# Patient Record
Sex: Female | Born: 1965 | Race: Black or African American | Hispanic: No | Marital: Married | Smoking: Never smoker
Health system: Southern US, Community
[De-identification: ages and names within clinical notes are randomized; demographics above are authoritative.]

## PROBLEM LIST (undated history)

## (undated) DIAGNOSIS — E785 Hyperlipidemia, unspecified: Secondary | ICD-10-CM

## (undated) DIAGNOSIS — J45909 Unspecified asthma, uncomplicated: Secondary | ICD-10-CM

## (undated) DIAGNOSIS — T7840XA Allergy, unspecified, initial encounter: Secondary | ICD-10-CM

## (undated) DIAGNOSIS — F419 Anxiety disorder, unspecified: Secondary | ICD-10-CM

## (undated) DIAGNOSIS — I10 Essential (primary) hypertension: Secondary | ICD-10-CM

## (undated) DIAGNOSIS — I519 Heart disease, unspecified: Secondary | ICD-10-CM

## (undated) DIAGNOSIS — D869 Sarcoidosis, unspecified: Secondary | ICD-10-CM

## (undated) DIAGNOSIS — L509 Urticaria, unspecified: Secondary | ICD-10-CM

## (undated) DIAGNOSIS — J069 Acute upper respiratory infection, unspecified: Secondary | ICD-10-CM

## (undated) HISTORY — DX: Allergy, unspecified, initial encounter: T78.40XA

## (undated) HISTORY — DX: Anxiety disorder, unspecified: F41.9

## (undated) HISTORY — DX: Heart disease, unspecified: I51.9

## (undated) HISTORY — DX: Hyperlipidemia, unspecified: E78.5

## (undated) HISTORY — DX: Unspecified asthma, uncomplicated: J45.909

## (undated) HISTORY — DX: Essential (primary) hypertension: I10

## (undated) HISTORY — DX: Sarcoidosis, unspecified: D86.9

## (undated) HISTORY — DX: Urticaria, unspecified: L50.9

## (undated) HISTORY — DX: Acute upper respiratory infection, unspecified: J06.9

## (undated) HISTORY — PX: TOTAL HIP ARTHROPLASTY: SHX124

---

## 2000-09-09 ENCOUNTER — Emergency Department (HOSPITAL_COMMUNITY): Admission: EM | Admit: 2000-09-09 | Discharge: 2000-09-09 | Payer: Self-pay | Admitting: Emergency Medicine

## 2000-09-09 ENCOUNTER — Encounter: Payer: Self-pay | Admitting: Emergency Medicine

## 2015-04-21 HISTORY — PX: CARDIAC CATHETERIZATION: SHX172

## 2015-09-30 ENCOUNTER — Other Ambulatory Visit: Payer: Self-pay | Admitting: Family Medicine

## 2015-09-30 DIAGNOSIS — N6489 Other specified disorders of breast: Principal | ICD-10-CM

## 2015-10-04 ENCOUNTER — Other Ambulatory Visit: Payer: Self-pay | Admitting: Family Medicine

## 2015-10-04 ENCOUNTER — Ambulatory Visit
Admission: RE | Admit: 2015-10-04 | Discharge: 2015-10-04 | Disposition: A | Discharge status: Home / Self Care | Payer: 59 | Source: Ambulatory Visit | Attending: Family Medicine | Admitting: Family Medicine

## 2015-10-04 DIAGNOSIS — N6489 Other specified disorders of breast: Principal | ICD-10-CM

## 2015-10-22 HISTORY — PX: BREAST LUMPECTOMY: SHX2

## 2016-07-06 ENCOUNTER — Ambulatory Visit (INDEPENDENT_AMBULATORY_CARE_PROVIDER_SITE_OTHER): Payer: 59 | Admitting: Allergy and Immunology

## 2016-07-06 ENCOUNTER — Encounter: Payer: Self-pay | Admitting: Allergy and Immunology

## 2016-07-06 VITALS — BP 162/100 | HR 80 | Temp 98.1°F | Resp 20 | Ht 62.0 in | Wt 155.4 lb

## 2016-07-06 DIAGNOSIS — H1010 Acute atopic conjunctivitis, unspecified eye: Secondary | ICD-10-CM

## 2016-07-06 DIAGNOSIS — D869 Sarcoidosis, unspecified: Secondary | ICD-10-CM

## 2016-07-06 DIAGNOSIS — K219 Gastro-esophageal reflux disease without esophagitis: Secondary | ICD-10-CM

## 2016-07-06 DIAGNOSIS — J4540 Moderate persistent asthma, uncomplicated: Secondary | ICD-10-CM

## 2016-07-06 DIAGNOSIS — T7800XD Anaphylactic reaction due to unspecified food, subsequent encounter: Secondary | ICD-10-CM

## 2016-07-06 DIAGNOSIS — R05 Cough: Secondary | ICD-10-CM

## 2016-07-06 DIAGNOSIS — J3089 Other allergic rhinitis: Principal | ICD-10-CM

## 2016-07-06 DIAGNOSIS — T7800XA Anaphylactic reaction due to unspecified food, initial encounter: Secondary | ICD-10-CM

## 2016-07-06 DIAGNOSIS — H1013 Acute atopic conjunctivitis, bilateral: Secondary | ICD-10-CM

## 2016-07-06 DIAGNOSIS — R053 Chronic cough: Secondary | ICD-10-CM

## 2016-07-06 MED ORDER — LEVOCETIRIZINE DIHYDROCHLORIDE 5 MG PO TABS: 5.0000 mg | ORAL_TABLET | Freq: Every evening | ORAL | 5 refills | Status: DC

## 2016-07-06 MED ORDER — EPINEPHRINE 0.3 MG/0.3ML IJ SOAJ: INTRAMUSCULAR | 2 refills | Status: DC

## 2016-07-06 MED ORDER — TIOTROPIUM BROMIDE MONOHYDRATE 1.25 MCG/ACT IN AERS: 2.0000 | INHALATION_SPRAY | Freq: Every day | RESPIRATORY_TRACT | 3 refills | Status: DC

## 2016-07-06 MED ORDER — AZELASTINE HCL 0.05 % OP SOLN: 1.0000 [drp] | Freq: Two times a day (BID) | OPHTHALMIC | 5 refills | Status: DC

## 2016-07-06 MED ORDER — ALBUTEROL SULFATE HFA 108 (90 BASE) MCG/ACT IN AERS: INHALATION_SPRAY | RESPIRATORY_TRACT | 1 refills | Status: DC

## 2016-07-06 MED ORDER — FLUTICASONE PROPIONATE 93 MCG/ACT NA EXHU: 2.0000 | INHALANT_SUSPENSION | Freq: Two times a day (BID) | NASAL | 5 refills | Status: DC

## 2016-07-06 MED ORDER — BUDESONIDE-FORMOTEROL FUMARATE 160-4.5 MCG/ACT IN AERO: 2.0000 | INHALATION_SPRAY | Freq: Two times a day (BID) | RESPIRATORY_TRACT | 5 refills | Status: DC

## 2016-07-06 MED ORDER — FLUTTER DEVI: 1 refills | Status: DC

## 2016-07-06 NOTE — Patient Instructions (Addendum)
Perennial and seasonal allergic rhinitis  Aeroallergen avoidance measures have been discussed and provided in written form.  A prescription has been provided for levocetirizine, 5mg  daily as needed.  A prescription has been provided for [facility]Xhance, 2 actuations per nostril twice a day. Proper technique has been discussed and demonstrated.  Nasal saline lavage (NeilMed) has been recommended as needed and prior to medicated nasal sprays along with instructions for proper administration.  For thick post nasal drainage, add guaifenesin 1200 mg (Mucinex Maximum Strength)  twice daily as needed with adequate hydration as discussed.  We will not restart immunotherapy at this time, however if allergen avoidance measures and medications fail to adequately alleviate symptoms, this option will be considered.  Allergic conjunctivitis  Treatment plan as outlined above for allergic rhinitis.  A prescription has been provided for Pataday, one drop per eye daily as needed.  I have also recommended eye lubricant drops (i.e., Natural Tears or Systane) as needed.  Moderate persistent asthma As coughing is a prominent feature of her symptom constellation, we will switch her away from dry powder inhalers.  A prescription has been [facility] (mometasone/formoterol) 200/5 g,  2 inhalations twice a day. To maximize pulmonary deposition, a spacer has been provided along with instructions for its proper administration with an HFA inhaler.  Discontinue Breo Ellipta.  A prescription for Spiriva 1.25 mcg Respimat, 2 inhalations daily.  Continue albuterol every 4-6 hours as needed.   Subjective and objective measures of pulmonary function will be followed and the treatment plan will be adjusted accordingly.  Acid reflux  Appropriate reflux lifestyle modifications have been discussed and provided.  Continue omeprazole as prescribed.  Cough, persistent The most common causes of chronic cough include  the following: upper airway cough syndrome (UACS) which is caused by variety of rhinosinus conditions; asthma; gastroesophageal reflux disease (GERD); chronic bronchitis from cigarette smoking or other inhaled environmental irritants; non-asthmatic eosinophilic bronchitis; and bronchiectasis. In prospective studies, these conditions have accounted for up to 94% of the causes of chronic cough in immunocompetent adults. The history and physical examination suggest that her cough is multifactorial with contribution from postnasal drainage, bronchial hyperresponsiveness, and acid reflux. We will address these issues at this time.   A prescription has been provided for a flutter valve to be used as needed to break the coughing cycle.  Treatment plan as outlined above.    We will regroup in 2 months to assess treatment response and adjust therapy accordingly.  Sarcoidosis  Follow up with Dr. Su MonksEjaz as directed for monitoring and treatment.  Food allergy  Continue careful avoidance of tree nuts and have access to epinephrine autoinjector 2 pack in case of accidental ingestion.  Food allergy action plan has been provided and discussed.   Return in about 2 months (around 09/05/2016), or if symptoms worsen or fail to improve.  Reducing Pollen Exposure  The American Academy of Allergy, Asthma and Immunology suggests the following steps to reduce your exposure to pollen during allergy seasons.    1. Do not hang sheets or clothing out to dry; pollen may collect on these items. 2. Do not mow lawns or spend time around freshly cut grass; mowing stirs up pollen. 3. Keep windows closed at night.  Keep car windows closed while driving. 4. Minimize morning activities outdoors, a time when pollen counts are usually at their highest. 5. Stay indoors as much as possible when pollen counts or humidity is high and on windy days when pollen tends to  remain in the air longer. 6. Use air conditioning when possible.   Many air conditioners have filters that trap the pollen spores. 7. Use a HEPA room air filter to remove pollen form the indoor air you breathe.   Control of Mold Allergen  Mold and fungi can grow on a variety of surfaces provided certain temperature and moisture conditions exist.  Outdoor molds grow on plants, decaying vegetation and soil.  The major outdoor mold, Alternaria and Cladosporium, are found in very high numbers during hot and dry conditions.  Generally, a late Summer - Fall peak is seen for common outdoor fungal spores.  Rain will temporarily lower outdoor mold spore count, but counts rise rapidly when the rainy period ends.  The most important indoor molds are Aspergillus and Penicillium.  Dark, humid and poorly ventilated basements are ideal sites for mold growth.  The next most common sites of mold growth are the bathroom and the kitchen.  Outdoor [employer] 3. Use air conditioning and keep windows closed 4. Avoid exposure to decaying vegetation. 5. Avoid leaf raking. 6. Avoid grain handling. 7. Consider wearing a face mask if working in moldy areas.  Indoor Mold Control 1. Maintain humidity below 50%. 2. Clean washable surfaces with 5% bleach solution. 3. Remove sources e.g. Contaminated carpets.  Control of Cockroach Allergen  Cockroach allergen has been identified as an important cause of acute attacks of asthma, especially in urban settings.  There are fifty-five species of cockroach that exist in the [place], however only three, the [place], [place] species produce allergen that can affect patients with Asthma.  Allergens can be obtained from fecal particles, egg casings and secretions from cockroaches.    1. Remove food sources. 2. Reduce access to water. 3. Seal access and entry points. 4. Spray runways with 0.5-1% Diazinon or Chlorpyrifos 5. Blow boric acid power under stoves and refrigerator. 6. Place bait stations (hydramethylnon) at feeding  sites.

## 2016-07-06 NOTE — Assessment & Plan Note (Signed)
   Appropriate reflux lifestyle modifications have been discussed and provided.  Continue omeprazole as prescribed.

## 2016-07-06 NOTE — Progress Notes (Signed)
New Patient Note  RE: Toni FalconerVanessa Molnar MRN: [id number] DOB: 04/30/1965 Date of Office Visit: 07/06/2016  Referring provider: Marisue BrooklynEjaz, Muhammad Shakir, * Primary care provider: Marisue BrooklynEjaz, Muhammad Shakir, MD  Chief Complaint: Allergic Rhinitis  and Asthma   History of present illness: Toni Hughes is a 51 y.o. female seen today in consultation requested by Marisue BrooklynMuhammad Shakir Ejaz, MD.  She complains of nasal congestion, rhinorrhea, sneezing, nasal pruritus, ocular pruritus, thick postnasal drainage, and occasional sinus pressure.  These symptoms occur year around but tender be more frequent and severe during the springtime.  She was started on aeroallergen immunotherapy at Dr. Kathi DerMoore's office last year but discontinued because she developed hives after some the injections.  She restarted this year, however reports that she experienced bruising at the injection sites, so she discontinued once again.  She has moderate persistent asthma with symptoms consisting of dyspnea, coughing, chest tightness, and wheezing.  She is currently taking Breo Ellipta 100-25 g, one inhalation in the morning, and Incruze Ellipta, 1 inhalation at bedtime.  These medications help to alleviate the chest tightness, dyspnea, and wheezing, however do not seem to provide benefit for the coughing.  The coughing is described as persistent and severe, occasionally resulting and posttussive emesis.  The cough feels like it originates from the base of her throat with a globus sensation that she is unable to expectorate.  She was diagnosed with sarcoidosis approximately 10 years ago.  This problem is being followed by her pulmonologist, Dr. Su MonksEjaz.  She reports that she has had systemic reactions with the consumption of tree nuts, the allergy having been confirmed with positive skin tests.  She avoids tree nuts but needs a refill for epinephrine autoinjectors.   Assessment and plan: Perennial and seasonal allergic rhinitis  Aeroallergen  avoidance measures have been discussed and provided in written form.  A prescription has been provided for levocetirizine, 5mg  daily as needed.  [facility], 2 actuations per nostril twice a day. Proper technique has been discussed and demonstrated.  Nasal saline lavage (NeilMed) has been recommended as needed and prior to medicated nasal sprays along with instructions for proper administration.  For thick post nasal drainage, add guaifenesin 1200 mg (Mucinex Maximum Strength)  twice daily as needed with adequate hydration as discussed.  We will not restart immunotherapy at this time, however if allergen avoidance measures and medications fail to adequately alleviate symptoms, this option will be considered.  Allergic conjunctivitis  Treatment plan as outlined above for allergic rhinitis.  A prescription has been provided for Pataday, one drop per eye daily as needed.  I have also recommended eye lubricant drops (i.e., Natural Tears or Systane) as needed.  Moderate persistent asthma As coughing is a prominent feature of her symptom constellation, we will switch her away from dry powder inhalers.  [facility] (mometasone/formoterol) 200/5 g,  2 inhalations twice a day. To maximize pulmonary deposition, a spacer has been provided along with instructions for its proper administration with an HFA inhaler.  Discontinue Breo Ellipta.  A prescription for Spiriva 1.25 mcg Respimat, 2 inhalations daily.  Continue albuterol every 4-6 hours as needed.   Subjective and objective measures of pulmonary function will be followed and the treatment plan will be adjusted accordingly.  Acid reflux  Appropriate reflux lifestyle modifications have been discussed and provided.  Continue omeprazole as prescribed.  Cough, persistent The most common causes of chronic cough include the following: upper airway  cough syndrome (UACS) which  is caused by variety of rhinosinus conditions; asthma; gastroesophageal reflux disease (GERD); chronic bronchitis from cigarette smoking or other inhaled environmental irritants; non-asthmatic eosinophilic bronchitis; and bronchiectasis. In prospective studies, these conditions have accounted for up to 94% of the causes of chronic cough in immunocompetent adults. The history and physical examination suggest that her cough is multifactorial with contribution from postnasal drainage, bronchial hyperresponsiveness, and acid reflux. We will address these issues at this time.   A prescription has been provided for a flutter valve to be used as needed to break the coughing cycle.  Treatment plan as outlined above.    We will regroup in 2 months to assess treatment response and adjust therapy accordingly.  Sarcoidosis  Follow up with Dr. Su Monks as directed for monitoring and treatment.  Food allergy  Continue careful avoidance of tree nuts and have access to epinephrine autoinjector 2 pack in case of accidental ingestion.  Food allergy action plan has been provided and discussed.   Meds ordered this encounter  Medications  . levocetirizine (XYZAL) 5 MG tablet    Sig: Take 1 tablet (5 mg total) by mouth every evening.    Dispense:  30 tablet    Refill:  5  . Fluticasone Propionate (XHANCE) 93 MCG/ACT EXHU    Sig: Place 2 sprays into the nose 2 (two) times daily.    Dispense:  16 mL    Refill:  5  . Tiotropium Bromide Monohydrate (SPIRIVA RESPIMAT) 1.25 MCG/ACT AERS    Sig: Inhale 2 puffs into the lungs daily.    Dispense:  4 g    Refill:  3  . Respiratory Therapy Supplies (FLUTTER) DEVI    Sig: Use as directed.    Dispense:  1 each    Refill:  1  . EPINEPHrine (AUVI-Q) 0.3 mg/0.3 mL IJ SOAJ injection    Sig: Use as directed for severe allergic reaction.    Dispense:  2 Device    Refill:  2  . albuterol (VENTOLIN HFA) 108 (90 Base) MCG/ACT inhaler    Sig: 2 puffs every 4 hours if  needed for cough or wheeze.    Dispense:  8 g    Refill:  1  . budesonide-formoterol (SYMBICORT) 160-4.5 MCG/ACT inhaler    Sig: Inhale 2 puffs into the lungs 2 (two) times daily.    Dispense:  1 Inhaler    Refill:  5  . azelastine (OPTIVAR) 0.05 % ophthalmic solution    Sig: Place 1 drop into both eyes 2 (two) times daily.    Dispense:  6 mL    Refill:  5    Diagnostics: Spirometry: Spirometry reveals an FVC of 2.50 L) 96% predicted) and an FEV1 of 1.60 L (77% predicted) with 120 mL (8%) post bronchodilator improvement.  Mild airways obstruction with partial reversibility. This study was performed while the patient was asymptomatic.  Please see scanned spirometry results for details. Epicutaneous testing: Positive to tree pollens and molds. Intradermal testing: Positive to cockroach antigen.    Physical examination: Blood pressure (!) 162/100, pulse 80, temperature 98.1 F (36.7 C), temperature source Oral, resp. rate 20, height 5\' 2"  (1.575 m), weight 155 lb 6.8 oz (70.5 kg), SpO2 96 %.  General: Alert, interactive, in no acute distress. HEENT: TMs pearly gray, turbinates edematous with thick discharge, post-pharynx moderately erythematous. Neck: Supple without lymphadenopathy. Lungs: Clear to auscultation without wheezing, rhonchi or rales. CV: Normal S1, S2 without murmurs. Abdomen: Nondistended, nontender. Skin: Warm  and dry, without lesions or rashes. Extremities:  No clubbing, cyanosis or edema. Neuro:   Grossly intact.  Review of systems:  Review of systems negative except as noted in HPI / PMHx or noted below: Review of Systems  Constitutional: Negative.   HENT: Negative.   Eyes: Negative.   Respiratory: Negative.   Cardiovascular: Negative.   Gastrointestinal: Negative.   Genitourinary: Negative.   Musculoskeletal: Negative.   Skin: Negative.   Neurological: Negative.   Endo/Heme/Allergies: Negative.   Psychiatric/Behavioral: Negative.     Past medical  history:  Past Medical History:  Diagnosis Date  . Allergy   . Anxiety   . Asthma   . Heart disease   . Hyperlipidemia   . Hypertension   . Sarcoidosis     Past surgical history:  Past Surgical History:  Procedure Laterality Date  . BREAST LUMPECTOMY Right 10/2015  . CARDIAC CATHETERIZATION  04/2015    Family history: Family History  Problem Relation Age of Onset  . Asthma Mother   . Allergic rhinitis Sister   . Asthma Sister   . Allergic rhinitis Brother   . Migraines Brother   . Allergic rhinitis Son   . Sinusitis Son   . Allergic rhinitis Daughter   . Asthma Daughter     Social history: Social History   Social History  . Marital status: Married    Spouse name: N/A  . Number of children: N/A  . Years of education: N/A   Occupational History  . Not on file.   Social History Main Topics  . Smoking status: Never Smoker  . Smokeless tobacco: Never Used  . Alcohol use No  . Drug use: No  . Sexual activity: Not on file   Other Topics Concern  . Not on file   Social History Narrative  . No narrative on file   Environmental History: The patient lives in a 53 year old house with carpeting throughout and central air/heat.  She is a nonsmoker without pets.  Allergies as of 07/06/2016      Reactions   Codeine Hives   Paranoid   Diphenhydramine Hcl Hives   Other Hives, Itching   Peanuts   Terbinafine Hives, Itching   Skin discoloration   Lamisil  [terbinafine Hcl] Hives   Penicillins Hives      Medication List       Accurate as of 07/06/16  2:57 PM. Always use your most recent med list.          albuterol 108 (90 Base) MCG/ACT inhaler Commonly known as:  VENTOLIN HFA 2 puffs every 4 hours if needed for cough or wheeze.   amLODipine 10 MG tablet Commonly known as:  NORVASC Take by mouth.   aspirin 81 MG chewable tablet Chew by mouth.   azelastine 0.05 % ophthalmic solution Commonly known as:  OPTIVAR Place 1 drop into both eyes 2 (two)  times daily.   budesonide-formoterol 160-4.5 MCG/ACT inhaler Commonly known as:  SYMBICORT Inhale 2 puffs into the lungs 2 (two) times daily.   cetirizine 10 MG tablet Commonly known as:  ZYRTEC Take by mouth.   EPINEPHrine 0.3 mg/0.3 mL Soaj injection Commonly known as:  AUVI-Q Use as directed for severe allergic reaction.   fexofenadine 180 MG tablet Commonly known as:  ALLEGRA Take 180 mg by mouth.   fluticasone furoate-vilanterol 100-25 MCG/INH Aepb Commonly known as:  BREO ELLIPTA Inhale into the lungs.   Fluticasone Propionate 93 MCG/ACT Exhu Commonly known as:  [employer]  2 sprays into the nose 2 (two) times daily.   FLUTTER Devi Use as directed.   hydrochlorothiazide 25 MG tablet Commonly known as:  HYDRODIURIL TAKE ONE HALF TABLET BY MOUTH EVERY DAY   isosorbide mononitrate 60 MG 24 hr tablet Commonly known as:  IMDUR Take by mouth.   levocetirizine 5 MG tablet Commonly known as:  XYZAL Take 1 tablet (5 mg total) by mouth every evening.   metoprolol tartrate 25 MG tablet Commonly known as:  LOPRESSOR Take by mouth.   mupirocin ointment 2 % Commonly known as:  BACTROBAN Apply topically.   nitroGLYCERIN 0.4 mg/hr patch Commonly known as:  NITRODUR - Dosed in mg/24 hr Place 0.4 mg onto the skin daily.   omeprazole 40 MG capsule Commonly known as:  PRILOSEC TAKE 1 CAPSULE(40 MG) BY MOUTH TWICE DAILY   Propylene Glycol 0.6 % Soln Place 1 drop into both eyes daily as needed.   rosuvastatin 40 MG tablet Commonly known as:  CRESTOR Take by mouth.   THERA Tabs Take by mouth.   ticagrelor 60 MG Tabs tablet Commonly known as:  BRILINTA Take by mouth.   Tiotropium Bromide Monohydrate 1.25 MCG/ACT Aers Commonly known as:  SPIRIVA RESPIMAT Inhale 2 puffs into the lungs daily.   traMADol 50 MG tablet Commonly known as:  ULTRAM Take by mouth.   umeclidinium bromide 62.5 MCG/INH Aepb Commonly known as:  INCRUSE ELLIPTA Inhale into the  lungs.       Known medication allergies: Allergies  Allergen Reactions  . Codeine Hives    Paranoid  . Diphenhydramine Hcl Hives  . Other Hives and Itching    Peanuts  . Terbinafine Hives and Itching    Skin discoloration  . Lamisil  [Terbinafine Hcl] Hives  . Penicillins Hives    I appreciate the opportunity to take part in Chezney's care. Please do not hesitate to contact me with questions.  Sincerely,   R. Jorene Guest, MD

## 2016-07-06 NOTE — Assessment & Plan Note (Deleted)
The most common causes of chronic cough include the following: upper airway cough syndrome (UACS) which is caused by variety of rhinosinus conditions; asthma; gastroesophageal reflux disease (GERD); chronic bronchitis from cigarette smoking or other inhaled environmental irritants; non-asthmatic eosinophilic bronchitis; and bronchiectasis. In prospective studies, these conditions have accounted for up to 94% of the causes of chronic cough in immunocompetent adults. The history and physical examination suggest that her cough is multifactorial with contribution from postnasal drainage, bronchial hyperresponsiveness, and acid reflux. We will address these issues at this time.   A prescription has been provided for a flutter valve to be used as needed to break the coughing cycle.  Treatment plan as outlined above.    We will regroup in 6 weeks to assess treatment response and adjust therapy accordingly.

## 2016-07-06 NOTE — Assessment & Plan Note (Signed)
   Follow up with Dr. Su MonksEjaz as directed for monitoring and treatment.

## 2016-07-06 NOTE — Assessment & Plan Note (Addendum)
As coughing is a prominent feature of her symptom constellation, we will switch her away from dry powder inhalers.  A [facility] (mometasone/formoterol) 200/5 g, 2 inhalations twice a day. To maximize pulmonary deposition, a spacer has been provided along with instructions for its proper administration with an HFA inhaler.  Discontinue Breo Ellipta.  A prescription for Spiriva 1.25 mcg Respimat, 2 inhalations daily.  Continue albuterol every 4-6 hours as needed.   Subjective and objective measures of pulmonary function will be followed and the treatment plan will be adjusted accordingly.

## 2016-07-06 NOTE — Assessment & Plan Note (Signed)
   Treatment plan as outlined above for allergic rhinitis.  A prescription has been provided for Pataday, one drop per eye daily as needed.  I have also recommended eye lubricant drops (i.e., Natural Tears or Systane) as needed.

## 2016-07-06 NOTE — Assessment & Plan Note (Addendum)
The most common causes of chronic cough include the following: upper airway cough syndrome (UACS) which is caused by variety of rhinosinus conditions; asthma; gastroesophageal reflux disease (GERD); chronic bronchitis from cigarette smoking or other inhaled environmental irritants; non-asthmatic eosinophilic bronchitis; and bronchiectasis. In prospective studies, these conditions have accounted for up to 94% of the causes of chronic cough in immunocompetent adults. The history and physical examination suggest that her cough is multifactorial with contribution from postnasal drainage, bronchial hyperresponsiveness, and acid reflux. We will address these issues at this time.   A prescription has been provided for a flutter valve to be used as needed to break the coughing cycle.  Treatment plan as outlined above.    We will regroup in 2 months to assess treatment response and adjust therapy accordingly.

## 2016-07-06 NOTE — Assessment & Plan Note (Addendum)
   Aeroallergen avoidance measures have been discussed and provided in written form.  A prescription has been provided for levocetirizine, 5mg  daily as needed.  [facility] 2 actuations per nostril twice a day. Proper technique has been discussed and demonstrated.  Nasal saline lavage (NeilMed) has been recommended as needed and prior to medicated nasal sprays along with instructions for proper administration.  For thick post nasal drainage, add guaifenesin 1200 mg (Mucinex Maximum Strength)  twice daily as needed with adequate hydration as discussed.  We will not restart immunotherapy at this time, however if allergen avoidance measures and medications fail to adequately alleviate symptoms, this option will be considered.

## 2016-07-06 NOTE — Assessment & Plan Note (Signed)
   Continue careful avoidance of tree nuts and have access to epinephrine autoinjector 2 pack in case of accidental ingestion.  Food allergy action plan has been provided and discussed.

## 2016-07-10 ENCOUNTER — Telehealth: Payer: Self-pay | Admitting: Allergy

## 2016-07-10 NOTE — Telephone Encounter (Signed)
Pharmacy  Called and wanted to know if is was both nostrils. informed them it was both and they wanted to know if they could give her two bottles for a month and I told them it was. [facility]

## 2016-09-27 ENCOUNTER — Ambulatory Visit (INDEPENDENT_AMBULATORY_CARE_PROVIDER_SITE_OTHER): Payer: 59 | Admitting: Allergy and Immunology

## 2016-09-27 ENCOUNTER — Encounter: Payer: Self-pay | Admitting: Allergy and Immunology

## 2016-09-27 VITALS — BP 134/90 | HR 60 | Temp 98.0°F | Resp 20

## 2016-09-27 DIAGNOSIS — D869 Sarcoidosis, unspecified: Secondary | ICD-10-CM

## 2016-09-27 DIAGNOSIS — J4541 Moderate persistent asthma with (acute) exacerbation: Principal | ICD-10-CM

## 2016-09-27 DIAGNOSIS — J3089 Other allergic rhinitis: Secondary | ICD-10-CM

## 2016-09-27 DIAGNOSIS — J019 Acute sinusitis, unspecified: Secondary | ICD-10-CM

## 2016-09-27 DIAGNOSIS — J0100 Acute maxillary sinusitis, unspecified: Secondary | ICD-10-CM

## 2016-09-27 DIAGNOSIS — T7800XD Anaphylactic reaction due to unspecified food, subsequent encounter: Secondary | ICD-10-CM

## 2016-09-27 MED ORDER — BUDESONIDE-FORMOTEROL FUMARATE 160-4.5 MCG/ACT IN AERO: 2.0000 | INHALATION_SPRAY | Freq: Two times a day (BID) | RESPIRATORY_TRACT | 5 refills | Status: DC

## 2016-09-27 MED ORDER — ALBUTEROL SULFATE HFA 108 (90 BASE) MCG/ACT IN AERS: INHALATION_SPRAY | RESPIRATORY_TRACT | 1 refills | Status: DC

## 2016-09-27 MED ORDER — TIOTROPIUM BROMIDE MONOHYDRATE 18 MCG IN CAPS: 18.0000 ug | ORAL_CAPSULE | Freq: Every day | RESPIRATORY_TRACT | 5 refills | Status: DC

## 2016-09-27 NOTE — Assessment & Plan Note (Signed)
   Continue meticulous avoidance of tree nuts and have access to epinephrine autoinjector 2 pack in case of accidental ingestion.  Food allergy action plan has been provided and discussed.

## 2016-09-27 NOTE — Assessment & Plan Note (Signed)
   Prednisone has been provided (as above).  Continue Xhance, 2 sprays per nostril twice daily.  Nasal saline lavage (NeilMed) has been recommended as needed and prior to medicated nasal sprays along with instructions for proper administration.  For thick post nasal drainage, nasal congestion, and/or sinus pressure, add guaifenesin 1200 mg (Mucinex Maximum Strength) plus/minus pseudoephedrine 120 mg  twice daily as needed with adequate hydration as discussed. Pseudoephedrine is only to be used for short-term relief of nasal/sinus congestion. Long-term use is discouraged due to potential side effects.  The patient has been asked to contact me if her symptoms persist, progress, or if she becomes febrile.

## 2016-09-27 NOTE — Patient Instructions (Signed)
Moderate persistent asthma  Prednisone has been provided, 20 mg x 4 days, 10 mg x1 day, then stop.  Start Symbicort 160-4.5 g, 2 inhalations via spacer device twice a day, and Spiriva 18 g, one inhalation daily.  A prescription has been provided.  Start Spiriva 18 g, one inhalation daily.  A prescription has been provided.  Continue albuterol HFA, 1-2 inhalations every 4-6 hours as needed.  The patient has been asked to contact me if her symptoms persist or progress. Otherwise, she may return for follow up in 4 months.  Acute sinusitis  Prednisone has been provided (as above).  Continue Xhance, 2 sprays per nostril twice daily.  Nasal saline lavage (NeilMed) has been recommended as needed and prior to medicated nasal sprays along with instructions for proper administration.  For thick post nasal drainage, nasal congestion, and/or sinus pressure, add guaifenesin 1200 mg (Mucinex Maximum Strength) plus/minus pseudoephedrine 120 mg  twice daily as needed with adequate hydration as discussed. Pseudoephedrine is only to be used for short-term relief of nasal/sinus congestion. Long-term use is discouraged due to potential side effects.  The patient has been asked to contact me if her symptoms persist, progress, or if she becomes febrile.   Food allergy  Continue meticulous avoidance of tree nuts and have access to epinephrine autoinjector 2 pack in case of accidental ingestion.  Food allergy action plan has been provided and discussed.  Sarcoidosis  Follow up with Dr. Su MonksEjaz as directed for monitoring.   Return in about 4 months (around 01/27/2017), or if symptoms worsen or fail to improve.

## 2016-09-27 NOTE — Assessment & Plan Note (Signed)
   Follow up with Dr. Su MonksEjaz as directed for monitoring.

## 2016-09-27 NOTE — Assessment & Plan Note (Addendum)
   Prednisone has been provided, 20 mg x 4 days, 10 mg x1 day, then stop.  Start Symbicort 160-4.5 g, 2 inhalations via spacer device twice a day, and Spiriva 18 g, one inhalation daily.  A prescription has been provided.  Start Spiriva 18 g, one inhalation daily.  A prescription has been provided.  Continue albuterol HFA, 1-2 inhalations every 4-6 hours as needed.  The patient has been asked to contact me if her symptoms persist or progress. Otherwise, she may return for follow up in 4 months.

## 2016-09-27 NOTE — Progress Notes (Addendum)
Follow-up Note  RE: Toni Hughes MRN: [id number] DOB: 01-31-1966 Date of Office Visit: 09/27/2016  Primary care provider: Marisue Brooklyn, MD Referring provider: Marisue Brooklyn, *  History of present illness: Toni Hughes is a 51 y.o. female with persistent asthma, allergic rhinoconjunctivitis, sarcoidosis, and food allergy presenting today for sick visit.  She is previously seen in this clinic for her initial evaluation on 07/06/2016.  She reports that over the past 3 days she has been experiencing increased coughing, sinus pressure, postnasal drainage, dyspnea, and chest tightness.  She admits that, for unclear reasons, she never picked up her control her asthma medications from the pharmacy after her initial visit.  She denies fevers, chills, or discolored mucus production.  She is currently taking Xhance nasal spray but has not been using nasal saline irrigation as recommended.   Assessment and plan: Moderate persistent asthma  Prednisone has been provided, 20 mg x 4 days, 10 mg x1 day, then stop.  Start Symbicort 160-4.5 g, 2 inhalations via spacer device twice a day, and Spiriva 18 g, one inhalation daily.  A prescription has been provided.  Start Spiriva 18 g, one inhalation daily.  A prescription has been provided.  Continue albuterol HFA, 1-2 inhalations every 4-6 hours as needed.  The patient has been asked to contact me if her symptoms persist or progress. Otherwise, she may return for follow up in 4 months.  Acute sinusitis  Prednisone has been provided (as above).  Continue Xhance, 2 sprays per nostril twice daily.  Nasal saline lavage (NeilMed) has been recommended as needed and prior to medicated nasal sprays along with instructions for proper administration.  For thick post nasal drainage, nasal congestion, and/or sinus pressure, add guaifenesin 1200 mg (Mucinex Maximum Strength) plus/minus pseudoephedrine 120 mg  twice daily as needed with  adequate hydration as discussed. Pseudoephedrine is only to be used for short-term relief of nasal/sinus congestion. Long-term use is discouraged due to potential side effects.  The patient has been asked to contact me if her symptoms persist, progress, or if she becomes febrile.   Food allergy  Continue meticulous avoidance of tree nuts and have access to epinephrine autoinjector 2 pack in case of accidental ingestion.  Food allergy action plan has been provided and discussed.  Sarcoidosis  Follow up with Dr. Su Monks as directed for monitoring.   Meds ordered this encounter  Medications  . albuterol (VENTOLIN HFA) 108 (90 Base) MCG/ACT inhaler    Sig: 2 puffs every 4 hours if needed for cough or wheeze.    Dispense:  1 Inhaler    Refill:  1  . tiotropium (SPIRIVA HANDIHALER) 18 MCG inhalation capsule    Sig: Place 1 capsule (18 mcg total) into inhaler and inhale daily.    Dispense:  30 capsule    Refill:  5  . budesonide-formoterol (SYMBICORT) 160-4.5 MCG/ACT inhaler    Sig: Inhale 2 puffs into the lungs 2 (two) times daily.    Dispense:  1 Inhaler    Refill:  5    Diagnostics: Spirometry reveals an FVC of 2.28 L and an FEV1 of 1.57 L (75% predicted) without significant post bronchodilator improvement.   Please see scanned spirometry results for details.    Physical examination: Blood pressure 134/90, pulse 60, temperature 98 F (36.7 C), temperature source Oral, resp. rate 20, SpO2 97 %.  General: Alert, interactive, in no acute distress. HEENT: TMs pearly gray, turbinates edematous without discharge, post-pharynx erythematous. Neck: Supple without lymphadenopathy.  Lungs: Clear to auscultation without wheezing, rhonchi or rales. CV: Normal S1, S2 without murmurs. Skin: Warm and dry, without lesions or rashes.   The following portions of the patient's history were reviewed and updated as appropriate: allergies, current medications, past family history, past medical  history, past social history, past surgical history and problem list.  Allergies as of 09/27/2016      Reactions   Codeine Hives   Paranoid   Diphenhydramine Hcl Hives   Other Hives, Itching   Peanuts   Terbinafine Hives, Itching   Skin discoloration   Lamisil  [terbinafine Hcl] Hives   Penicillins Hives      Medication List       Accurate as of 09/27/16  5:51 PM. Always use your most recent med list.          albuterol 108 (90 Base) MCG/ACT inhaler Commonly known as:  VENTOLIN HFA 2 puffs every 4 hours if needed for cough or wheeze.   amLODipine 10 MG tablet Commonly known as:  NORVASC Take by mouth.   aspirin 81 MG chewable tablet Chew by mouth.   azelastine 0.05 % ophthalmic solution Commonly known as:  OPTIVAR Place 1 drop into both eyes 2 (two) times daily.   budesonide-formoterol 160-4.5 MCG/ACT inhaler Commonly known as:  SYMBICORT Inhale 2 puffs into the lungs 2 (two) times daily.   budesonide-formoterol 160-4.5 MCG/ACT inhaler Commonly known as:  SYMBICORT Inhale 2 puffs into the lungs 2 (two) times daily.   cetirizine 10 MG tablet Commonly known as:  ZYRTEC Take by mouth.   EPINEPHrine 0.3 mg/0.3 mL Soaj injection Commonly known as:  AUVI-Q Use as directed for severe allergic reaction.   fexofenadine 180 MG tablet Commonly known as:  ALLEGRA Take 180 mg by mouth.   fluticasone furoate-vilanterol 100-25 MCG/INH Aepb Commonly known as:  BREO ELLIPTA Inhale into the lungs.   Fluticasone Propionate 93 MCG/ACT Exhu Commonly known as:  XHANCE Place 2 sprays into the nose 2 (two) times daily.   FLUTTER Devi Use as directed.   hydrochlorothiazide 25 MG tablet Commonly known as:  HYDRODIURIL TAKE ONE HALF TABLET BY MOUTH EVERY DAY   isosorbide mononitrate 60 MG 24 hr tablet Commonly known as:  IMDUR Take by mouth.   levocetirizine 5 MG tablet Commonly known as:  XYZAL Take 1 tablet (5 mg total) by mouth every evening.   metoprolol tartrate  25 MG tablet Commonly known as:  LOPRESSOR Take by mouth.   mupirocin ointment 2 % Commonly known as:  BACTROBAN Apply topically.   nitroGLYCERIN 0.4 mg/hr patch Commonly known as:  NITRODUR - Dosed in mg/24 hr Place 0.4 mg onto the skin daily.   omeprazole 40 MG capsule Commonly known as:  PRILOSEC TAKE 1 CAPSULE(40 MG) BY MOUTH TWICE DAILY   Propylene Glycol 0.6 % Soln Place 1 drop into both eyes daily as needed.   rosuvastatin 40 MG tablet Commonly known as:  CRESTOR Take by mouth.   THERA Tabs Take by mouth.   ticagrelor 60 MG Tabs tablet Commonly known as:  BRILINTA Take by mouth.   Tiotropium Bromide Monohydrate 1.25 MCG/ACT Aers Commonly known as:  SPIRIVA RESPIMAT Inhale 2 puffs into the lungs daily.   tiotropium 18 MCG inhalation capsule Commonly known as:  SPIRIVA HANDIHALER Place 1 capsule (18 mcg total) into inhaler and inhale daily.   traMADol 50 MG tablet Commonly known as:  ULTRAM Take by mouth.   umeclidinium bromide 62.5 MCG/INH Aepb Commonly known as:  INCRUSE ELLIPTA Inhale into the lungs.       Allergies  Allergen Reactions  . Codeine Hives    Paranoid  . Diphenhydramine Hcl Hives  . Other Hives and Itching    Peanuts  . Terbinafine Hives and Itching    Skin discoloration  . Lamisil  [Terbinafine Hcl] Hives  . Penicillins Hives   Review of systems: Review of systems negative except as noted in HPI / PMHx or noted below: Constitutional: Negative.  HENT: Negative.   Eyes: Negative.  Respiratory: Negative.   Cardiovascular: Negative.  Gastrointestinal: Negative.  Genitourinary: Negative.  Musculoskeletal: Negative.  Neurological: Negative.  Endo/Heme/Allergies: Negative.  Cutaneous: Negative.  Past Medical History:  Diagnosis Date  . Allergy   . Anxiety   . Asthma   . Heart disease   . Hyperlipidemia   . Hypertension   . Sarcoidosis     Family History  Problem Relation Age of Onset  . Asthma Mother   . Allergic  rhinitis Sister   . Asthma Sister   . Allergic rhinitis Brother   . Migraines Brother   . Allergic rhinitis Son   . Sinusitis Son   . Allergic rhinitis Daughter   . Asthma Daughter     Social History   Social History  . Marital status: Married    Spouse name: N/A  . Number of children: N/A  . Years of education: N/A   Occupational History  . Not on file.   Social History Main Topics  . Smoking status: Never Smoker  . Smokeless tobacco: Never Used  . Alcohol use No  . Drug use: No  . Sexual activity: Not on file   Other Topics Concern  . Not on file   Social History Narrative  . No narrative on file    I appreciate the opportunity to take part in [place] care. Please do not hesitate to contact me with questions.  Sincerely,   R. Jorene Guest, MD

## 2018-10-31 ENCOUNTER — Emergency Department (HOSPITAL_BASED_OUTPATIENT_CLINIC_OR_DEPARTMENT_OTHER)
Admission: EM | Admit: 2018-10-31 | Discharge: 2018-10-31 | Disposition: A | Discharge status: Home / Self Care | Payer: Medicaid Other | Attending: Emergency Medicine | Admitting: Emergency Medicine

## 2018-10-31 ENCOUNTER — Other Ambulatory Visit: Payer: Self-pay

## 2018-10-31 ENCOUNTER — Encounter (HOSPITAL_BASED_OUTPATIENT_CLINIC_OR_DEPARTMENT_OTHER): Payer: Self-pay | Admitting: Emergency Medicine

## 2018-10-31 ENCOUNTER — Emergency Department (HOSPITAL_BASED_OUTPATIENT_CLINIC_OR_DEPARTMENT_OTHER): Payer: Medicaid Other

## 2018-10-31 DIAGNOSIS — Z7982 Long term (current) use of aspirin: Secondary | ICD-10-CM | POA: Diagnosis not present

## 2018-10-31 DIAGNOSIS — J45909 Unspecified asthma, uncomplicated: Secondary | ICD-10-CM | POA: Diagnosis not present

## 2018-10-31 DIAGNOSIS — Y92007 Garden or yard of unspecified non-institutional (private) residence as the place of occurrence of the external cause: Secondary | ICD-10-CM | POA: Diagnosis not present

## 2018-10-31 DIAGNOSIS — Z79899 Other long term (current) drug therapy: Secondary | ICD-10-CM | POA: Diagnosis not present

## 2018-10-31 DIAGNOSIS — I110 Hypertensive heart disease with heart failure: Secondary | ICD-10-CM | POA: Diagnosis not present

## 2018-10-31 DIAGNOSIS — Y999 Unspecified external cause status: Secondary | ICD-10-CM | POA: Insufficient documentation

## 2018-10-31 DIAGNOSIS — Z885 Allergy status to narcotic agent status: Secondary | ICD-10-CM | POA: Insufficient documentation

## 2018-10-31 DIAGNOSIS — S93401A Sprain of unspecified ligament of right ankle, initial encounter: Secondary | ICD-10-CM | POA: Insufficient documentation

## 2018-10-31 DIAGNOSIS — S99911A Unspecified injury of right ankle, initial encounter: Secondary | ICD-10-CM | POA: Diagnosis present

## 2018-10-31 DIAGNOSIS — W010XXA Fall on same level from slipping, tripping and stumbling without subsequent striking against object, initial encounter: Secondary | ICD-10-CM | POA: Diagnosis not present

## 2018-10-31 DIAGNOSIS — I509 Heart failure, unspecified: Secondary | ICD-10-CM | POA: Diagnosis not present

## 2018-10-31 DIAGNOSIS — Z880 Allergy status to penicillin: Secondary | ICD-10-CM | POA: Diagnosis not present

## 2018-10-31 DIAGNOSIS — Z888 Allergy status to other drugs, medicaments and biological substances status: Secondary | ICD-10-CM | POA: Insufficient documentation

## 2018-10-31 DIAGNOSIS — Y9302 Activity, running: Secondary | ICD-10-CM | POA: Diagnosis not present

## 2018-10-31 MED ORDER — IBUPROFEN 600 MG PO TABS: 600.0000 mg | ORAL_TABLET | Freq: Four times a day (QID) | ORAL | 0 refills | Status: DC | PRN

## 2018-10-31 MED ORDER — HYDROCODONE-ACETAMINOPHEN 5-325 MG PO TABS
1.0000 | ORAL_TABLET | Freq: Once | ORAL | Status: AC
  Administered 2018-10-31: 21:00:00 1 via ORAL
  Filled 2018-10-31: qty 1

## 2018-10-31 MED ORDER — HYDROCODONE-ACETAMINOPHEN 5-325 MG PO TABS: 1.0000 | ORAL_TABLET | Freq: Four times a day (QID) | ORAL | 0 refills | Status: DC | PRN

## 2018-10-31 NOTE — ED Notes (Signed)
Patient transported to X-ray 

## 2018-10-31 NOTE — ED Triage Notes (Signed)
Pt c/o right ankle pain after falling while chasing her dog. Pt has noted swelling to lateral right ankle.

## 2018-10-31 NOTE — Discharge Instructions (Signed)
Use crutches, ice and elevate the foot, try and stay off of it as much as possible over the next few days and then weightbearing as tolerated.  Use ibuprofen 600 mg every 6 hours, Norco as needed for breakthrough pain.  If ankle pain is not improving over the next week please follow-up with Dr. Raeford Razor with sports medicine.

## 2018-10-31 NOTE — ED Notes (Signed)
ED Provider at bedside. 

## 2018-10-31 NOTE — ED Provider Notes (Signed)
[facility] EMERGENCY DEPARTMENT Provider Note   CSN: [id number] Arrival date & time: 10/31/18  1933     History   Chief Complaint Chief Complaint  Patient presents with  . Ankle Pain    HPI Toni Hughes is a 53 y.o. female.     Toni Hughes is a 53 y.o. female with a history of hypertension, hyperlipidemia, sarcoidosis, asthma and anxiety, who presents to the ED for evaluation of right ankle injury.  Reports that she was running across the yard to chase after her dog, and did not see a hole her right ankle went down into the hole rolling to the side and causing her to fall down, she caught herself and did not sustain any other injuries from the fall but reports since then has had constant pain and swelling to the right ankle.  Pain is present over the medial and lateral aspects but is worse over the lateral aspect of the ankle.  She reports some pain over the top of the proximal foot but none over the distal forefoot.  No pain over the calf knee or hip.  No prior surgeries or injuries to the ankle.  She has not taken anything for pain prior to arrival, no numbness weakness or tingling.  No cuts or wounds.  No other aggravating or alleviating factors.     Past Medical History:  Diagnosis Date  . Allergy   . Anxiety   . Asthma   . Heart disease   . Hyperlipidemia   . Hypertension   . Sarcoidosis     Patient Active Problem List   Diagnosis Date Noted  . Acute sinusitis 09/27/2016  . Perennial and seasonal allergic rhinitis 07/06/2016  . Allergic conjunctivitis 07/06/2016  . Moderate persistent asthma 07/06/2016  . Cough, persistent 07/06/2016  . Acid reflux 07/06/2016  . Sarcoidosis 07/06/2016  . Food allergy 07/06/2016    Past Surgical History:  Procedure Laterality Date  . BREAST LUMPECTOMY Right 10/2015  . CARDIAC CATHETERIZATION  04/2015     OB History   No obstetric history on file.      Home Medications    Prior to Admission medications    Medication Sig Start Date End Date Taking? Authorizing Provider  albuterol (VENTOLIN HFA) 108 (90 Base) MCG/ACT inhaler 2 puffs every 4 hours if needed for cough or wheeze. 09/27/16   Bobbitt, Heywood Ilesalph Carter, MD  amLODipine (NORVASC) 10 MG tablet Take by mouth.    [provider]  aspirin 81 MG chewable tablet Chew by mouth.    [provider]  azelastine (OPTIVAR) 0.05 % ophthalmic solution Place 1 drop into both eyes 2 (two) times daily. 07/06/16   Bobbitt, Heywood Ilesalph Carter, MD  budesonide-formoterol [facility](SYMBICORT) 160-4.5 MCG/ACT inhaler Inhale 2 puffs into the lungs 2 (two) times daily. 07/06/16   Bobbitt, Heywood Ilesalph Carter, MD  budesonide-formoterol [facility](SYMBICORT) 160-4.5 MCG/ACT inhaler Inhale 2 puffs into the lungs 2 (two) times daily. 09/27/16   Bobbitt, Heywood Ilesalph Carter, MD  cetirizine (ZYRTEC) 10 MG tablet Take by mouth.    [provider]  EPINEPHrine (AUVI-Q) 0.3 mg/0.3 mL IJ SOAJ injection Use as directed for severe allergic reaction. 07/06/16   Bobbitt, Heywood Ilesalph Carter, MD  fexofenadine (ALLEGRA) 180 MG tablet Take 180 mg by mouth. 12/30/15   [provider]  fluticasone furoate-vilanterol (BREO ELLIPTA) 100-25 MCG/INH AEPB Inhale into the lungs.    [provider]  Fluticasone Propionate (XHANCE) 93 MCG/ACT EXHU Place 2 sprays into the nose 2 (two) times  daily. 07/06/16   Bobbitt, Heywood Iles, MD  hydrochlorothiazide (HYDRODIURIL) 25 MG tablet TAKE ONE HALF TABLET BY MOUTH EVERY DAY 05/18/16   [provider]  HYDROcodone-acetaminophen (NORCO) 5-325 MG tablet Take 1 tablet by mouth every 6 (six) hours as needed. 10/31/18   Dartha Lodge, PA-C  ibuprofen (ADVIL) 600 MG tablet Take 1 tablet (600 mg total) by mouth every 6 (six) hours as needed. 10/31/18   Dartha Lodge, PA-C  isosorbide mononitrate (IMDUR) 60 MG 24 hr tablet Take by mouth. 11/26/15 11/25/16  [provider]  levocetirizine (XYZAL) 5 MG tablet Take 1 tablet (5 mg total) by mouth every evening.  07/06/16   Bobbitt, Heywood Iles, MD  metoprolol tartrate (LOPRESSOR) 25 MG tablet Take by mouth. 11/26/15 11/25/16  [provider]  Multiple Vitamin (THERA) TABS Take by mouth.    [provider]  mupirocin ointment (BACTROBAN) 2 % Apply topically. 04/20/14   [provider]  nitroGLYCERIN (NITRODUR - DOSED IN MG/24 HR) 0.4 mg/hr patch Place 0.4 mg onto the skin daily.    [provider]  omeprazole (PRILOSEC) 40 MG capsule TAKE 1 CAPSULE(40 MG) BY MOUTH TWICE DAILY 03/14/16   [provider]  Propylene Glycol 0.6 % SOLN Place 1 drop into both eyes daily as needed.    [provider]  Respiratory Therapy Supplies (FLUTTER) DEVI Use as directed. 07/06/16   Bobbitt, Heywood Iles, MD  rosuvastatin (CRESTOR) 40 MG tablet Take by mouth. 11/26/15 11/25/16  [provider]  ticagrelor (BRILINTA) 60 MG TABS tablet Take by mouth. 11/26/15   [provider]  tiotropium (SPIRIVA HANDIHALER) 18 MCG inhalation capsule Place 1 capsule (18 mcg total) into inhaler and inhale daily. 09/27/16   Bobbitt, Heywood Iles, MD  Tiotropium Bromide Monohydrate (SPIRIVA RESPIMAT) 1.25 MCG/ACT AERS Inhale 2 puffs into the lungs daily. 07/06/16   Bobbitt, Heywood Iles, MD  umeclidinium bromide (INCRUSE ELLIPTA) 62.5 MCG/INH AEPB Inhale into the lungs.    [provider]    Family History Family History  Problem Relation Age of Onset  . Asthma Mother   . Allergic rhinitis Sister   . Asthma Sister   . Allergic rhinitis Brother   . Migraines Brother   . Allergic rhinitis Son   . Sinusitis Son   . Allergic rhinitis Daughter   . Asthma Daughter     Social History Social History   Tobacco Use  . Smoking status: Never Smoker  . Smokeless tobacco: Never Used  Substance Use Topics  . Alcohol use: No  . Drug use: No     Allergies   Codeine, Diphenhydramine hcl, Other, Terbinafine, Lamisil  [terbinafine], and Penicillins   Review of Systems  Review of Systems  Constitutional: Negative for chills and fever.  Musculoskeletal: Positive for arthralgias and joint swelling.  Skin: Negative for color change and rash.  Neurological: Negative for weakness and numbness.     Physical Exam Updated Vital Signs BP (!) 155/102   Pulse 74   Temp 98.2 F (36.8 C) (Oral)   Resp 14   Ht 5\' 2"  (1.575 m)   Wt 72.6 kg   SpO2 97%   BMI 29.26 kg/m   Physical Exam Vitals signs and nursing note reviewed.  Constitutional:      General: She is not in acute distress.    Appearance: She is well-developed. She is not diaphoretic.  HENT:     Head: Normocephalic and atraumatic.  Eyes:     General:  Right eye: No discharge.        Left eye: No discharge.  Pulmonary:     Effort: Pulmonary effort is normal. No respiratory distress.  Musculoskeletal:     Comments: There is swelling and tenderness over the lateral malleolus, as well as the medial malleolus. No overt deformity.  No tenderness over the calcaneus or Achilles.  The fifth metatarsal is not tender. The ankle joint is intact without excessive opening on stressing. 2+ DP and PT pulses, normal sensation, good cap refill.  Range of motion limited by pain.  Skin:    General: Skin is warm and dry.     Capillary Refill: Capillary refill takes less than 2 seconds.  Neurological:     Mental Status: She is alert.     Coordination: Coordination normal.  Psychiatric:        Behavior: Behavior normal.      ED Treatments / Results  Labs (all labs ordered are listed, but only abnormal results are displayed) Labs Reviewed - No data to display  EKG None  Radiology Dg Ankle Complete Right  Result Date: 10/31/2018 CLINICAL DATA:  Right ankle pain, swelling.  Fall. EXAM: RIGHT ANKLE - COMPLETE 3+ VIEW COMPARISON:  None. FINDINGS: Diffuse soft tissue swelling. No acute bony abnormality. Specifically, no fracture, subluxation, or dislocation. IMPRESSION: No acute bony abnormality.  Electronically Signed   By: Rolm Baptise M.D.   On: 10/31/2018 20:30    Procedures Procedures (including critical care time)  Medications Ordered in ED Medications  HYDROcodone-acetaminophen (NORCO/VICODIN) 5-325 MG per tablet 1 tablet (1 tablet Oral Given 10/31/18 2100)     Initial Impression / Assessment and Plan / ED Course  I have reviewed the triage vital signs and the nursing notes.  Pertinent labs & imaging results that were available during my care of the patient were reviewed by me and considered in my medical decision making (see chart for details).  Presentation consistent with ankle sprain. Tenderness and swelling over medial and lateral malleolus, pt is neurovascularly intact, and x-ray negative for fracture, and shows ankle mortise is intact. Pain treated in the ED. Pt placed in a cam walker boot for comfort and provided crutches, ambulated without difficulty. Pt stable for discharge home with ibuprofen for pain, given that this is a fairly severe sprain short course of Norco provided for breakthrough pain. Pt to follow-up with ortho in one week if symptoms not improving. Return precautions discussed, Pt expresses understanding and agrees with plan.   Final Clinical Impressions(s) / ED Diagnoses   Final diagnoses:  Sprain of right ankle, unspecified ligament, initial encounter    ED Discharge Orders         Ordered    HYDROcodone-acetaminophen (NORCO) 5-325 MG tablet  Every 6 hours PRN     10/31/18 2118    ibuprofen (ADVIL) 600 MG tablet  Every 6 hours PRN     10/31/18 2118           Jacqlyn Larsen, PA-C 11/01/18 1212    Gareth Morgan, MD 11/02/18 1530

## 2018-11-12 ENCOUNTER — Ambulatory Visit: Payer: Medicaid Other | Admitting: Family Medicine

## 2018-11-12 NOTE — Progress Notes (Deleted)
Toni Hughes - 53 y.o. female MRN [id number]  Date of birth: Aug 18, 1965  SUBJECTIVE:  Including CC & ROS.  No chief complaint on file.   Toni Hughes is a 53 y.o. female that is  ***.  ***   Review of Systems  HISTORY: Past Medical, Surgical, Social, and Family History Reviewed & Updated per EMR.   Pertinent Historical Findings include:  Past Medical History:  Diagnosis Date  . Allergy   . Anxiety   . Asthma   . Heart disease   . Hyperlipidemia   . Hypertension   . Sarcoidosis     Past Surgical History:  Procedure Laterality Date  . BREAST LUMPECTOMY Right 10/2015  . CARDIAC CATHETERIZATION  04/2015    Allergies  Allergen Reactions  . Codeine Hives    Paranoid  . Diphenhydramine Hcl Hives  . Other Hives and Itching    Peanuts  . Terbinafine Hives and Itching    Skin discoloration  . Lamisil  [Terbinafine] Hives  . Penicillins Hives    Family History  Problem Relation Age of Onset  . Asthma Mother   . Allergic rhinitis Sister   . Asthma Sister   . Allergic rhinitis Brother   . Migraines Brother   . Allergic rhinitis Son   . Sinusitis Son   . Allergic rhinitis Daughter   . Asthma Daughter      Social History   Socioeconomic History  . Marital status: Married    Spouse name: Not on file  . Number of children: Not on file  . Years of education: Not on file  . Highest education level: Not on file  Occupational History  . Not on file  Social Needs  . Financial resource strain: Not on file  . Food insecurity    Worry: Not on file    Inability: Not on file  . Transportation needs    Medical: Not on file    Non-medical: Not on file  Tobacco Use  . Smoking status: Never Smoker  . Smokeless tobacco: Never Used  Substance and Sexual Activity  . Alcohol use: No  . Drug use: No  . Sexual activity: Not on file  Lifestyle  . Physical activity    Days per week: Not on file    Minutes per session: Not on file  . Stress: Not on file   Relationships  . Social Herbalist on phone: Not on file    Gets together: Not on file    Attends religious service: Not on file    Active member of club or organization: Not on file    Attends meetings of clubs or organizations: Not on file    Relationship status: Not on file  . Intimate partner violence    Fear of current or ex partner: Not on file    Emotionally abused: Not on file    Physically abused: Not on file    Forced sexual activity: Not on file  Other Topics Concern  . Not on file  Social History Narrative  . Not on file     PHYSICAL EXAM:  VS: There were no vitals taken for this visit. Physical Exam Gen: NAD, alert, cooperative with exam, well-appearing ENT: normal lips, normal nasal mucosa,  Eye: normal EOM, normal conjunctiva and lids CV:  no edema, +2 pedal pulses   Resp: no accessory muscle use, non-labored,  GI: no masses or tenderness, no hernia  Skin: no rashes, no areas of  induration  Neuro: normal tone, normal sensation to touch Psych:  normal insight, alert and oriented MSK:  ***      ASSESSMENT & PLAN:   No problem-specific Assessment & Plan notes found for this encounter.

## 2018-11-13 ENCOUNTER — Ambulatory Visit: Payer: Self-pay

## 2018-11-13 ENCOUNTER — Ambulatory Visit: Payer: Medicaid Other | Admitting: Family Medicine

## 2018-11-13 ENCOUNTER — Encounter: Payer: Self-pay | Admitting: Family Medicine

## 2018-11-13 ENCOUNTER — Telehealth: Payer: Self-pay | Admitting: Family Medicine

## 2018-11-13 ENCOUNTER — Other Ambulatory Visit: Payer: Self-pay

## 2018-11-13 VITALS — BP 141/91 | Ht 62.0 in | Wt 158.0 lb

## 2018-11-13 DIAGNOSIS — M25571 Pain in right ankle and joints of right foot: Secondary | ICD-10-CM

## 2018-11-13 DIAGNOSIS — S93409A Sprain of unspecified ligament of unspecified ankle, initial encounter: Secondary | ICD-10-CM | POA: Insufficient documentation

## 2018-11-13 MED ORDER — AMBULATORY NON FORMULARY MEDICATION: 0 refills | Status: DC

## 2018-11-13 NOTE — Telephone Encounter (Signed)
Patient's insurance will not cover a rolling knee scooter. Patient is asking if there another option

## 2018-11-13 NOTE — Assessment & Plan Note (Signed)
Has not had much improvement since her injury on 9/10.  Clinical exam and ultrasound are suggestive of a fracture. -Written for rolling knee scooter. -Advised to be nonweightbearing. -CT scan of the ankle to evaluate for fracture.

## 2018-11-13 NOTE — Telephone Encounter (Signed)
Left VM for patient. If she calls back please have her speak with a nurse/CMA and inform she can ask around for scooter for at churches.   If any questions then please take the best time and phone number to call and I will try to call her back.   Rosemarie Ax, MD [facility] 11/13/2018, 4:54 PM

## 2018-11-13 NOTE — Progress Notes (Signed)
Toni Hughes - 53 y.o. female MRN [id number]  Date of birth: 1965/11/19  SUBJECTIVE:  Including CC & ROS.  Chief Complaint  Patient presents with  . Ankle Pain    right ankle x 10-31-2018    Toni Hughes is a 53 y.o. female that is  Presenting with right ankle pain. The injury occurred on 9/10. She is still having significant swelling and ecchymosis. The pain is generalized around the ankle joint. She has been taking pain medication and anti-inflammatories but still having severe pain. The pain is worse with walking and after elevating her foot. Pain is throbbing and sharp. No numbness or tingling. Thinks she had an inversion injury. Pain is on the ankle and foot anteriorly.  Independent review of the right ankle x-ray from 9/10 shows no acute abnormality.   Review of Systems  Constitutional: Negative for fever.  HENT: Negative for congestion.   Respiratory: Negative for cough.   Cardiovascular: Negative for chest pain.  Gastrointestinal: Negative for abdominal pain.  Musculoskeletal: Positive for gait problem and joint swelling.  Skin: Negative for color change.  Neurological: Negative for weakness.  Hematological: Negative for adenopathy.    HISTORY: Past Medical, Surgical, Social, and Family History Reviewed & Updated per EMR.   Pertinent Historical Findings include:  Past Medical History:  Diagnosis Date  . Allergy   . Anxiety   . Asthma   . Heart disease   . Hyperlipidemia   . Hypertension   . Sarcoidosis     Past Surgical History:  Procedure Laterality Date  . BREAST LUMPECTOMY Right 10/2015  . CARDIAC CATHETERIZATION  04/2015    Allergies  Allergen Reactions  . Codeine Hives    Paranoid  . Diphenhydramine Hcl Hives  . Other Hives and Itching    Peanuts  . Terbinafine Hives and Itching    Skin discoloration  . Lamisil  [Terbinafine] Hives  . Penicillins Hives    Family History  Problem Relation Age of Onset  . Asthma Mother   .  Allergic rhinitis Sister   . Asthma Sister   . Allergic rhinitis Brother   . Migraines Brother   . Allergic rhinitis Son   . Sinusitis Son   . Allergic rhinitis Daughter   . Asthma Daughter      Social History   Socioeconomic History  . Marital status: Married    Spouse name: Not on file  . Number of children: Not on file  . Years of education: Not on file  . Highest education level: Not on file  Occupational History  . Not on file  Social Needs  . Financial resource strain: Not on file  . Food insecurity    Worry: Not on file    Inability: Not on file  . Transportation needs    Medical: Not on file    Non-medical: Not on file  Tobacco Use  . Smoking status: Never Smoker  . Smokeless tobacco: Never Used  Substance and Sexual Activity  . Alcohol use: No  . Drug use: No  . Sexual activity: Not on file  Lifestyle  . Physical activity    Days per week: Not on file    Minutes per session: Not on file  . Stress: Not on file  Relationships  . Social Musician on phone: Not on file    Gets together: Not on file    Attends religious service: Not on file    Active member of club or  organization: Not on file    Attends meetings of clubs or organizations: Not on file    Relationship status: Not on file  . Intimate partner violence    Fear of current or ex partner: Not on file    Emotionally abused: Not on file    Physically abused: Not on file    Forced sexual activity: Not on file  Other Topics Concern  . Not on file  Social History Narrative  . Not on file     PHYSICAL EXAM:  VS: BP (!) 141/91   Ht 5\' 2"  (1.575 m)   Wt 158 lb (71.7 kg)   BMI 28.90 kg/m  Physical Exam Gen: NAD, alert, cooperative with exam, well-appearing ENT: normal lips, normal nasal mucosa,  Eye: normal EOM, normal conjunctiva and lids CV:  no edema, +2 pedal pulses   Resp: no accessory muscle use, non-labored,  Skin: no rashes, no areas of induration  Neuro: normal tone,  normal sensation to touch Psych:  normal insight, alert and oriented MSK:  Right ankle: Significant ecchymosis and swelling of the ankle joint and foot. Limited range of motion. Significant tenderness palpation over the anterior ankle joint and distal tibia. Significant pain with ambulation. Neurovascular intact  Limited ultrasound: Right ankle:  The medial anterior tibial joint appears to have a large effusion or hematoma extending proximally up the medial shaft of the tibia.  There seems to be an abnormality of the medial ankle joint to suggest a fracture in this area  Summary: Findings suggestive fracture of the medial ankle joint.  Ultrasound and interpretation by Clearance Coots, MD      ASSESSMENT & PLAN:   Acute right ankle pain Has not had much improvement since her injury on 9/10.  Clinical exam and ultrasound are suggestive of a fracture. -Written for rolling knee scooter. -Advised to be nonweightbearing. -CT scan of the ankle to evaluate for fracture.

## 2018-11-13 NOTE — Patient Instructions (Signed)
Nice to meet you Avoid placing weight on the ankle  Please elevate and continue ice  Please use ibuprofen as needed for pain   Please send me a message in MyChart with any questions or updates.  We will schedule a virtual visit once we have the results.   --Dr. Raeford Razor

## 2018-11-18 ENCOUNTER — Other Ambulatory Visit: Payer: Self-pay

## 2018-11-18 ENCOUNTER — Ambulatory Visit (HOSPITAL_BASED_OUTPATIENT_CLINIC_OR_DEPARTMENT_OTHER)
Admission: RE | Admit: 2018-11-18 | Discharge: 2018-11-18 | Disposition: A | Discharge status: Home / Self Care | Payer: Medicaid Other | Source: Ambulatory Visit | Attending: Family Medicine | Admitting: Family Medicine

## 2018-11-18 DIAGNOSIS — M25571 Pain in right ankle and joints of right foot: Secondary | ICD-10-CM | POA: Insufficient documentation

## 2018-11-18 NOTE — Addendum Note (Signed)
Addended by: Sherrie George F on: 11/18/2018 12:13 PM   Modules accepted: Orders

## 2018-11-19 ENCOUNTER — Ambulatory Visit (INDEPENDENT_AMBULATORY_CARE_PROVIDER_SITE_OTHER): Payer: Medicaid Other | Admitting: Family Medicine

## 2018-11-19 DIAGNOSIS — S93491D Sprain of other ligament of right ankle, subsequent encounter: Secondary | ICD-10-CM

## 2018-11-19 NOTE — Progress Notes (Signed)
Virtual Visit via Video Note  I connected with Toni Hughes on 11/19/18 at  1:50 PM EDT by a video enabled telemedicine application and verified that I am speaking with the correct person using two identifiers.   I discussed the limitations of evaluation and management by telemedicine and the availability of in person appointments. The Hughes expressed understanding and agreed to proceed.  History of Present Illness:  Toni Hughes is a 53 year old female that has following up after her CT of her right ankle.  She has had ongoing pain and swelling.  She is unable to get a rolling knee scooter.  She has been elevating her leg but has significant pain when she lets it down.  Has still been using the cam walker.  Pain is more severe when her leg has not been elevated.  Pain still occurring around the ankle itself.  Independent review of the right ankle CT scan shows no fracture.  Shows a calcification near the distal fibula which could be avulsion fracture.  A cortical bone fragment is inferior to the medial malleolus.  Does not appear to be associate with acute injury.   Observations/Objective:  Gen: NAD, alert, well-appearing ENT: normal lips, normal nasal mucosa,  Eye: normal EOM, normal conjunctiva and lids Resp: no accessory muscle use, non-labored,  Psych:  normal insight, alert and oriented   Assessment and Plan:  CT was not demonstrating an acute fracture.  Possible that she may have a high ankle sprain associated with this or just a severe ankle sprain itself. - provide rollator  - counseled on supportive care. ACE wrap and icing  - counseled on HEP  - f/u in 4 weeks   Follow Up Instructions:    I discussed the assessment and treatment plan with the Hughes. The Hughes was provided an opportunity to ask questions and all were answered. The Hughes agreed with the plan and demonstrated an understanding of the instructions.   The Hughes was advised to call back or seek an  in-person evaluation if the symptoms worsen or if the condition fails to improve as anticipated.    Clearance Coots, MD

## 2018-11-19 NOTE — Assessment & Plan Note (Addendum)
CT was not demonstrating an acute fracture.  Possible that she may have a high ankle sprain associated with this or just a severe ankle sprain itself. - provide rollator  - counseled on supportive care. ACE wrap and icing  - counseled on HEP  - f/u in 4 weeks

## 2018-12-17 ENCOUNTER — Ambulatory Visit: Payer: Medicaid Other | Admitting: Family Medicine

## 2020-09-14 IMAGING — CT CT ANKLE*R* W/O CM
3 of 4 series · 13 of 33 positions shown, 16 images · non-contrast
Comparison: Plain films of the left ankle 10/31/2018.

CLINICAL DATA: Right ankle pain and tenderness since the patient
fell while chasing her dog 10/31/2018. Subsequent encounter.

EXAM:
CT OF THE RIGHT ANKLE WITHOUT CONTRAST
TECHNIQUE: Multidetector CT imaging of the right ankle was performed according
to the standard protocol. Multiplanar CT image reconstructions were
also generated.

[Series 5: axial st · axial · 0.29mm/px · z∈[-185,-55]mm · 7 of 154 slices shown, 9 images]
[im 12/154  soft-tissue]
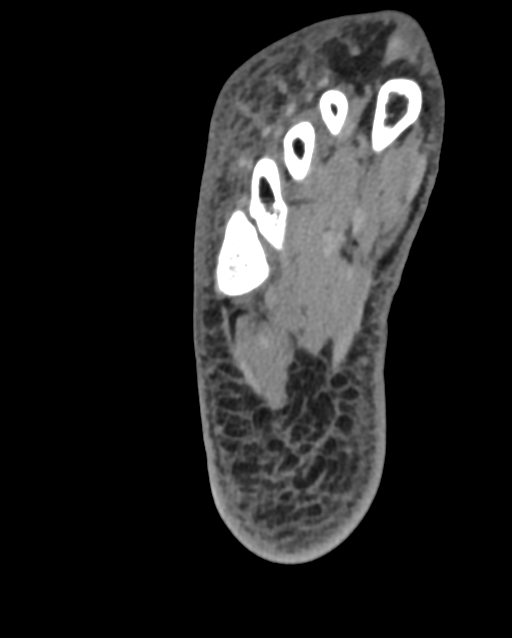
[im 12/154  bone]
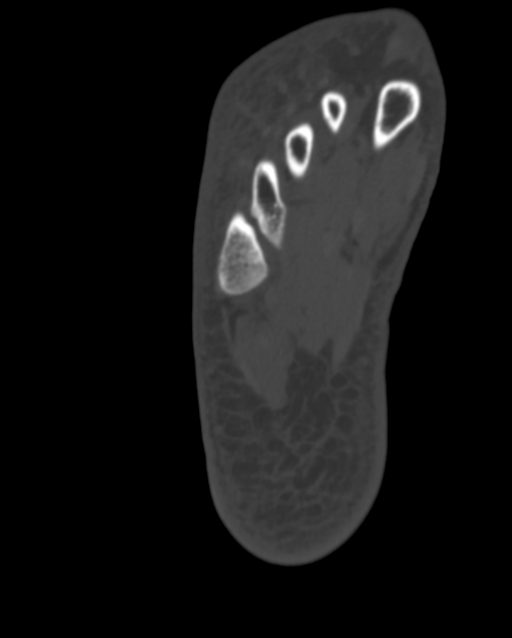
[im 36/154  bone]
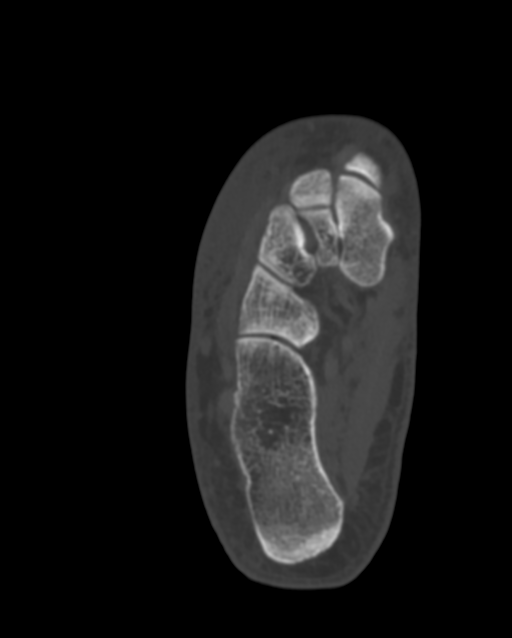
[im 59/154  bone]
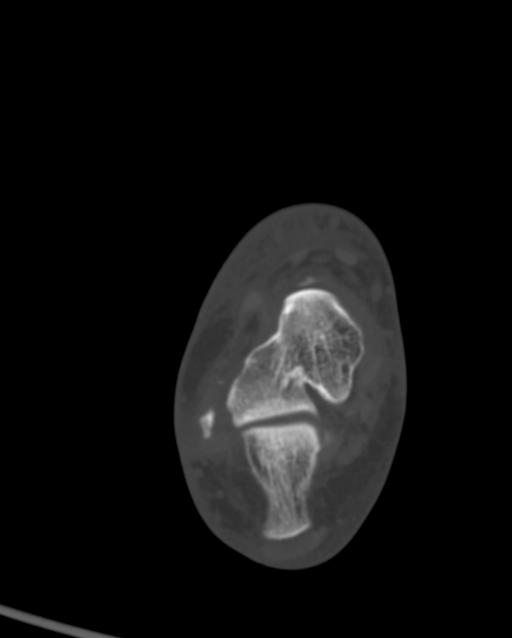
[im 83/154  bone]
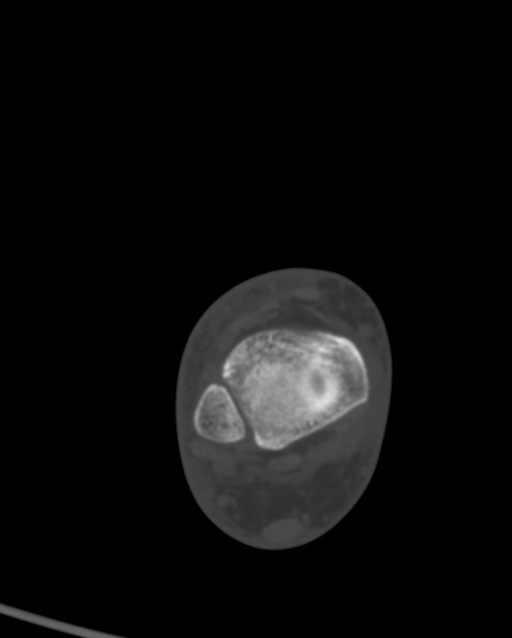
[im 95/154  soft-tissue]
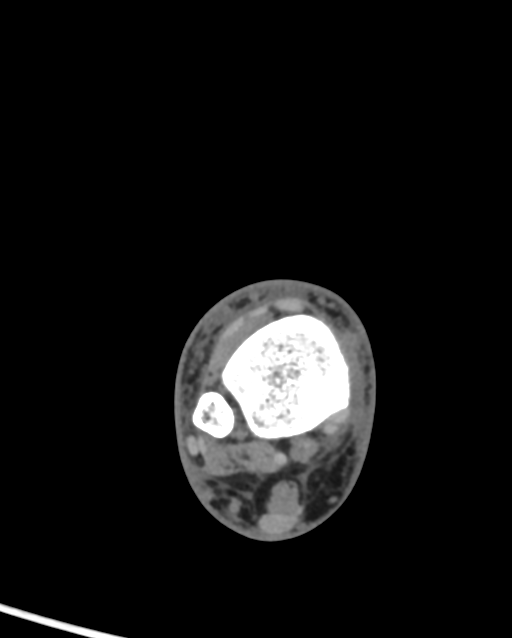
[im 95/154  bone]
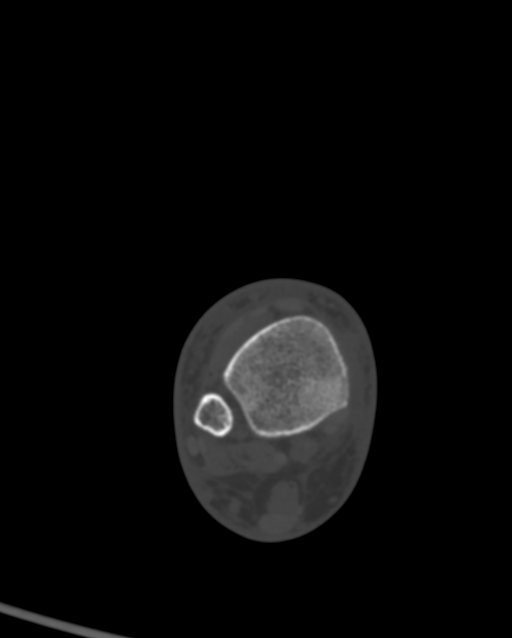
[im 118/154  bone]
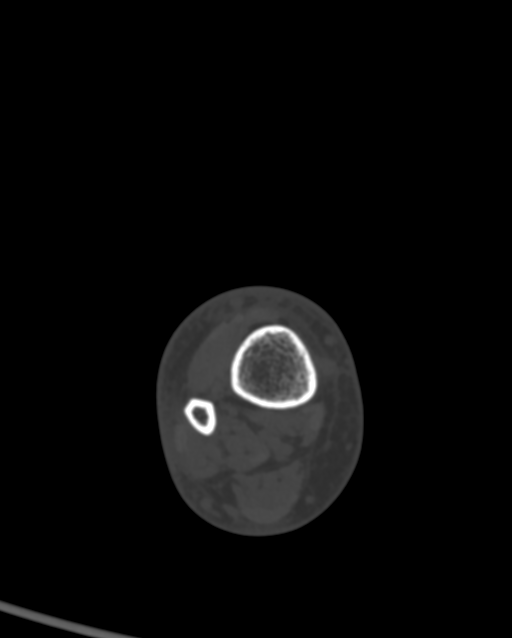
[im 142/154  bone]
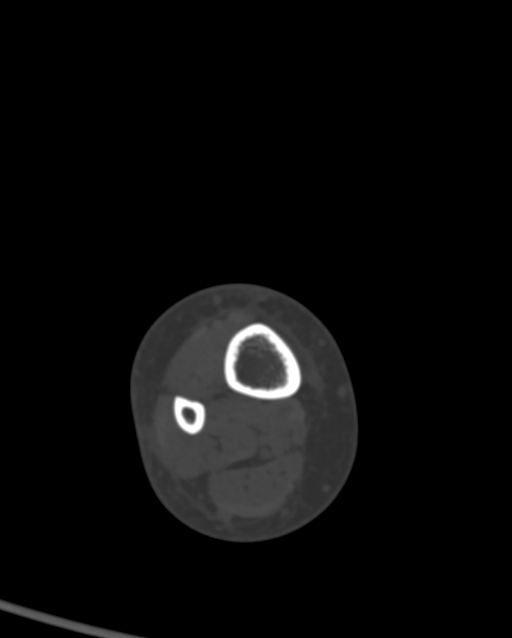

[Series 7: cor st · coronal · 0.30mm/px · 1 of 154 slices shown]
[im 77/154  bone]
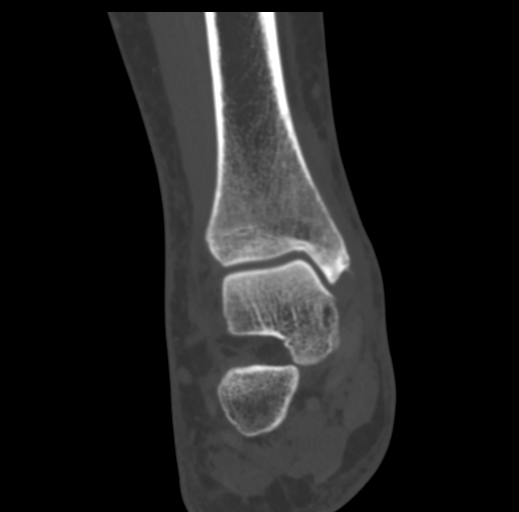

[Series 9: sag st · sagittal · 0.30mm/px · 5 of 90 slices shown, 6 images]
[im 30/90  bone]
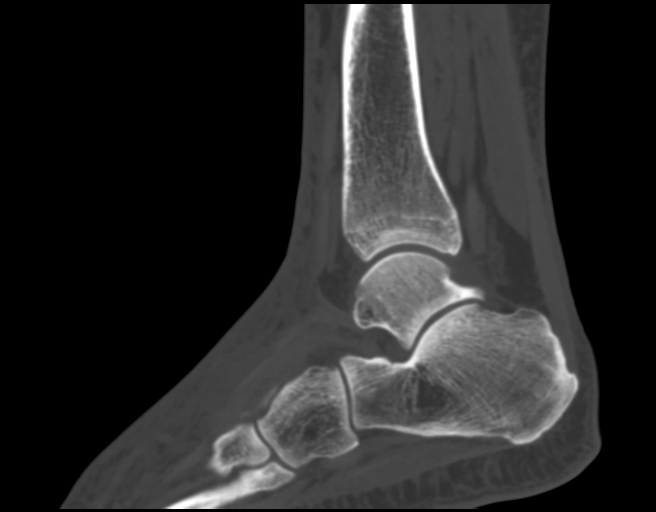
[im 38/90  bone]
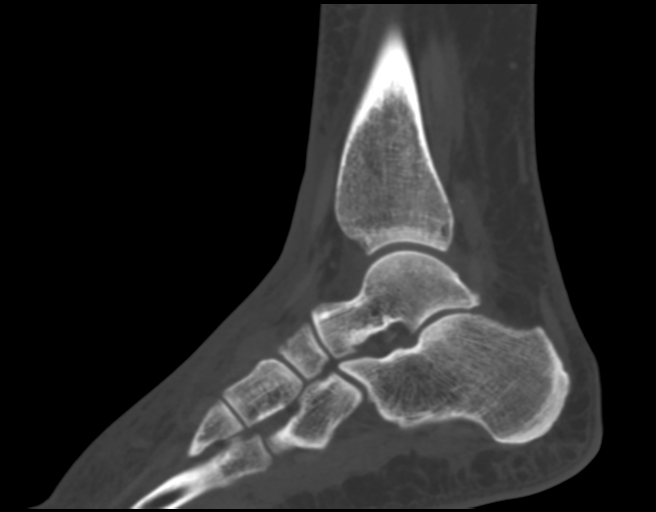
[im 45/90  soft-tissue]
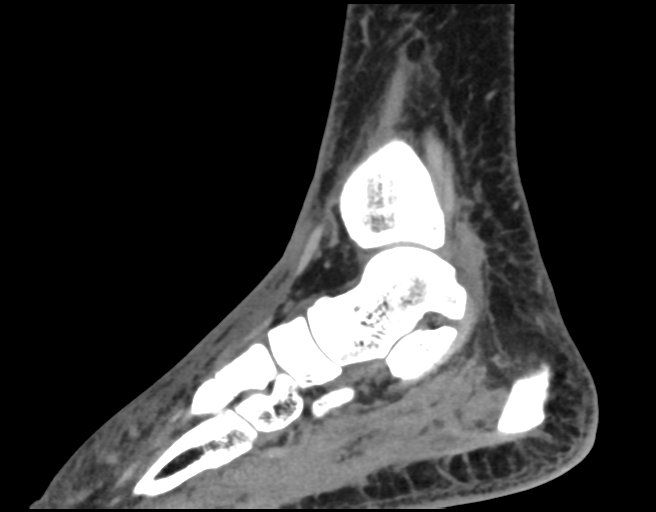
[im 45/90  bone]
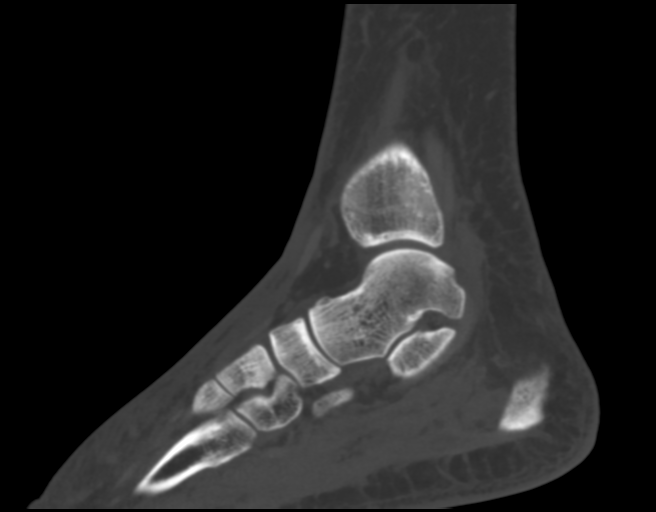
[im 52/90  bone]
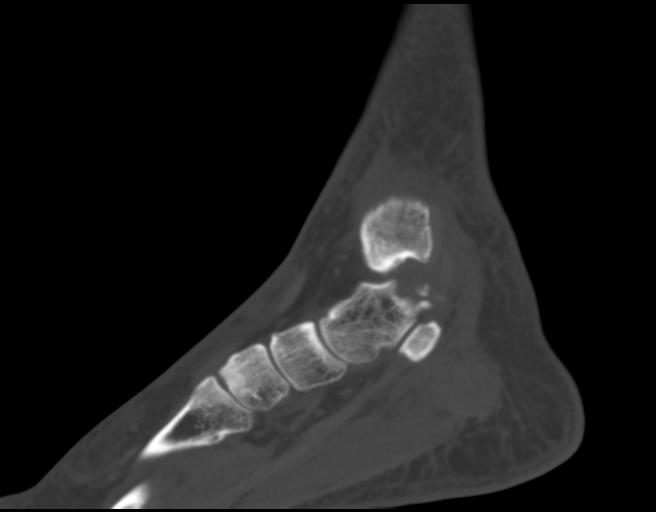
[im 60/90  bone]
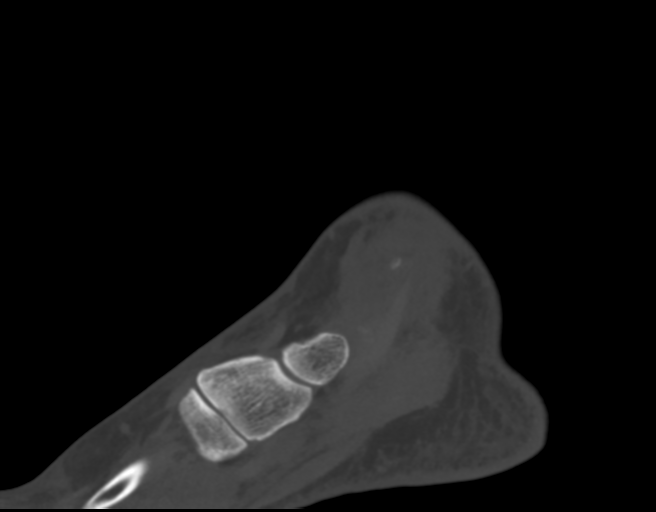

[13 of 33 positions shown; findings below may reference images not displayed]

FINDINGS: Bones/Joint/Cartilage

A small well corticated bone fragment is seen off the tip of the
medial malleolus which could be an accessory ossicle or due to old
trauma. There is also a tiny bone fragment anterior to the lateral
malleolus which may be due to an avulsion fracture, age
indeterminate. No donor site is identified. Imaged bones otherwise
appear normal. No osteochondral lesion of the talar dome.

Ligaments

Suboptimally assessed by CT.

Muscles and Tendons

Appear intact.

Soft tissues

Soft tissues about the ankle and dorsum of the foot are swollen. No
focal fluid collection.
IMPRESSION: Soft tissue swelling about the ankle and foot.

Tiny calcification anterior to the distal fibula may be due to
avulsion fracture although no donor site is identified. Small well
corticated bone fragment off the tip of the medial malleolus could
be an accessory ossicle or due to remote injury. Imaged bones
otherwise appear normal.

## 2020-12-17 ENCOUNTER — Encounter: Payer: Self-pay | Admitting: Internal Medicine

## 2020-12-17 ENCOUNTER — Ambulatory Visit (INDEPENDENT_AMBULATORY_CARE_PROVIDER_SITE_OTHER): Payer: Medicare Other | Admitting: Internal Medicine

## 2020-12-17 ENCOUNTER — Other Ambulatory Visit: Payer: Self-pay

## 2020-12-17 VITALS — BP 138/98 | HR 59 | Temp 98.5°F | Resp 20 | Ht 62.4 in | Wt 156.6 lb

## 2020-12-17 DIAGNOSIS — J309 Allergic rhinitis, unspecified: Secondary | ICD-10-CM | POA: Diagnosis not present

## 2020-12-17 DIAGNOSIS — J4550 Severe persistent asthma, uncomplicated: Secondary | ICD-10-CM

## 2020-12-17 DIAGNOSIS — T7800XA Anaphylactic reaction due to unspecified food, initial encounter: Secondary | ICD-10-CM

## 2020-12-17 MED ORDER — IPRATROPIUM BROMIDE 0.03 % NA SOLN: 2.0000 | Freq: Three times a day (TID) | NASAL | 12 refills | Status: DC

## 2020-12-17 MED ORDER — BREZTRI AEROSPHERE 160-9-4.8 MCG/ACT IN AERO: 2.0000 | INHALATION_SPRAY | Freq: Two times a day (BID) | RESPIRATORY_TRACT | 3 refills | Status: DC

## 2020-12-17 NOTE — Progress Notes (Signed)
NEW PATIENT Date of Service/Encounter:  12/17/20 Referring provider: Marisue Brooklyn, * Primary care provider: Elvera Lennox, PA-C  Subjective:  Toni Hughes is a 55 y.o. female with a PMHx of perennial and seasonal allergic rhinitis, allergic conjunctivitis, moderate persistent asthma, food allergy, sarcoidosis presenting today for evaluation of asthma, allergic rhinitis, and tree nut allergy. History obtained from: chart review and patient.   Every time the season changes, she gets "in a tail spin" where she is tired, coughing, congestion, eyes watery. Her PCP referred her back to Pulmonary for care of her asthma and history of sarcoidosis. However, she was told sarcoidosis was stable and not the cause of her current symptoms.  She was advised to return to our clinic for consideration of allergic contribution to her symptoms. She is using Symbicort 160 2 puffs BID, albuterol as needed-which she is using a few times per day because of shortness of breath and wheezing.  No nighttime symptoms. She has not tried [place] yet because she saw a commercial that it was for COPD. She has been on prednisone twice this year.  She feels she has needed it more than this, but her PCP was unwilling to give it more without her first thing evaluated by a pulmonary or allergy.   No ED/UC visits for breathing. Never hospitalized in the past for breathing. She takes Flonase and sinus rinses which she has used three times since the season changes.   She also uses Mucinex which she appreciates.  She has a lot of drainage. No facial pain or fevers. She has been on allergy shots in the past at another clinic(she thinks around 2016, but only did this for a month or so). She is also on Singulair, Allegra and Zyrtec.  She has SEVERE needle phobia.  Of note, she is a former patient of Dr. Nunzio Cobbs with her last visit in 2018.  She was followed at that time for her moderate persistent asthma (on Symbicort 160,  Spiriva 18, and albuterol as needed).  At her last visit was also diagnosed with acute sinusitis on Xhance.  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  She has been followed by pulmonary for chronic interstitial lung disease with a previous history of chronic hypersensitivity pneumonitis/sarcoidosis with a past normal hypersensitivity panel and serum ACE levels.  Unclear if this is biopsy proven sarcoidosis.  She was recently switched to [facility], and rereferred to our clinic to consider retesting and potential allergy shots for control of her underlying asthma.  Previous diagnostics:  SPT 2018: environmental positive to ash, maple, alternaria, bipolaris, dreschlera, and (IDs):cockroach   Most recent CT: 05/14/18-IMPRESSION:  1. Stable findings consistent with chronic sarcoidosis.  2. No active lung disease or other acute findings.   Most recent Echo 11/03/20: -normal echocardiogram Left Ventricle: Systolic function is normal. EF: 55-60%.    Aortic Valve: The aortic valve is tricuspid. The leaflets exhibit  normal excursion.    Aortic Valve: There is no regurgitation.    Mitral Valve: There is mild regurgitation.    Tricuspid Valve: There is mild regurgitation.    Tricuspid Valve: The right ventricular systolic pressure is normal (<36  mmHg).   Other allergy screening: Medication allergy: yes Hymenoptera allergy: no  Past Medical History: Past Medical History:  Diagnosis Date   Allergy    Anxiety    Asthma    Heart disease    Hyperlipidemia    Hypertension    Recurrent upper respiratory infection (URI)  Sarcoidosis    Urticaria    Medication List:  Current Outpatient Medications  Medication Sig Dispense Refill   albuterol (VENTOLIN HFA) 108 (90 Base) MCG/ACT inhaler 2 puffs every 4 hours if needed for cough or wheeze. 1 Inhaler 1   AMBULATORY NON FORMULARY MEDICATION Rolling Knee Scooter, please take Rx to medical supply store. 1 each 0   amLODipine (NORVASC)  10 MG tablet Take by mouth.     aspirin 81 MG chewable tablet Chew by mouth.     budesonide-formoterol (SYMBICORT) 160-4.5 MCG/ACT inhaler Inhale 2 puffs into the lungs 2 (two) times daily. 1 Inhaler 5   carvedilol (COREG) 6.25 MG tablet Take 6.25 mg by mouth 2 (two) times daily.     cetirizine (ZYRTEC) 10 MG tablet Take by mouth.     EPINEPHrine (AUVI-Q) 0.3 mg/0.3 mL IJ SOAJ injection Use as directed for severe allergic reaction. 2 Device 2   Fluticasone Propionate (XHANCE) 93 MCG/ACT EXHU Place 2 sprays into the nose 2 (two) times daily. 16 mL 5   hydrALAZINE (APRESOLINE) 25 MG tablet Take by mouth.     ibuprofen (ADVIL) 600 MG tablet Take 1 tablet (600 mg total) by mouth every 6 (six) hours as needed. 30 tablet 0   losartan-hydrochlorothiazide (HYZAAR) 100-12.5 MG tablet Take 1 tablet by mouth daily.     meloxicam (MOBIC) 7.5 MG tablet Take 7.5 mg by mouth daily.     montelukast (SINGULAIR) 10 MG tablet Take 10 mg by mouth daily.     Multiple Vitamins-Minerals (ONCOVITE) TABS Take by mouth.     mupirocin ointment (BACTROBAN) 2 % Apply topically.     nitroGLYCERIN (NITRODUR - DOSED IN MG/24 HR) 0.4 mg/hr patch Place 0.4 mg onto the skin daily.     pantoprazole (PROTONIX) 40 MG tablet Take 40 mg by mouth daily.     ranolazine (RANEXA) 500 MG 12 hr tablet Take by mouth.     umeclidinium bromide (INCRUSE ELLIPTA) 62.5 MCG/INH AEPB Inhale into the lungs.     isosorbide mononitrate (IMDUR) 60 MG 24 hr tablet Take by mouth.     metoprolol tartrate (LOPRESSOR) 25 MG tablet Take by mouth.     rosuvastatin (CRESTOR) 40 MG tablet Take by mouth.     No current facility-administered medications for this visit.   Known Allergies:  Allergies  Allergen Reactions   Codeine Hives    Paranoid   Diphenhydramine Hcl Hives   Other Hives and Itching    Peanuts   Terbinafine Hives and Itching    Skin discoloration   Lamisil  [Terbinafine] Hives   Penicillins Hives   Past Surgical History: Past  Surgical History:  Procedure Laterality Date   BREAST LUMPECTOMY Right 10/2015   CARDIAC CATHETERIZATION  04/2015   Family History: Family History  Problem Relation Age of Onset   Asthma Mother    Allergic rhinitis Sister    Asthma Sister    Allergic rhinitis Brother    Migraines Brother    Allergic rhinitis Daughter    Asthma Daughter    Eczema Son    Allergic rhinitis Son    Sinusitis Son    Urticaria Neg Hx    Social History: Xcaret lives in a 57 year old house without water damage, carpet in bedroom, electric heating, central AC, pet dog, no pests, no smoke exposure.  She does not work and draws disability.  + HEPA filter in the home.  [place]   ROS:  All  other systems negative except as noted per HPI.  Objective:  Blood pressure (!) 138/98, pulse (!) 59, temperature 98.5 F (36.9 C), temperature source Temporal, resp. rate 20, height 5' 2.4" (1.585 m), weight 156 lb 9.6 oz (71 kg), SpO2 96 %. Body mass index is 28.28 kg/m. Physical Exam:  General Appearance:  Alert, cooperative, no distress, appears stated age  Head:  Normocephalic, without obvious abnormality, atraumatic  Eyes:  Conjunctiva clear, EOM's intact  Nose: Nares normal, hypertrophic turbinates, clear rhinorrhea  Throat: Lips, tongue normal; teeth and gums normal, + cobblestoning  Neck: Supple, symmetrical  Lungs:   Clear to auscultation bilaterally, prolonged expiratory phase, respirations unlabored, no coughing  Heart:  Regular rate and rhythm, no murmur appears well perfused  Extremities: No edema  Skin: Skin color, texture, turgor normal, no rashes or lesions on visualized portions of skin  Neurologic: No gross deficits   Diagnostics: Spirometry:  Tracings reviewed. Her effort: It was hard to get consistent efforts and there is a question as to whether this reflects a maximal maneuver. FVC: 2.37L (pre), 2.39L  (post) FEV1: 1.30L, 63% predicted (pre), 1.45L,  70% predicted (post)-12%, 150L improvement FEV1/FVC ratio: 69% (pre), 76% (post) Interpretation: Spirometry consistent with moderate obstructive disease with significant bronchodilator response (12% increase in FEV1) Please see scanned spirometry results for details. Of note, her inhaler technique was noted to be poor during albuterol inhalation prior to post. She was educated on proper technique with real time feedback.  Assessment and Plan   Patient Instructions  Severe Persistent Asthma-uncontrolled: - your breathing test today shows moderate obstruction with significant response to bronchodilator which is consistent with asthma - Start Breztri 2 puffs twice a day with a spacer; THIS SHOULD BE USED EVERY DAY - Use Albuterol (Proair/Ventolin) 2 puffs every 4-6 hours as needed for chest tightness, wheezing, or coughing - Use Albuterol (Proair/Ventolin) 2 puffs 15 minutes prior to exercise if you have symptoms with activity - please keep track of how often you are needing rescue inhaler Albuterol (Proair/Ventolin) as this will help guide future management - Asthma is not controlled if:  - Symptoms are occurring >2 times a week OR  - >2 times a month nighttime awakenings  - Please call the clinic to schedule a follow up if these symptoms arise -Labs today: IgE, environmental panel, and CBCd for consideration of asthma biologics  Allergic Rhinitis-seasonal and perennial-uncontrolled - will update your environmental panel with a blood test - last skin test 2018: positive to ash, maple, alternaria, bipolaris, dreschlera, and cockroach  - allergen avoidance  - consider allergy shots as long term control of your symptoms by teaching your immune system to be more tolerant of your allergy triggers - Start Nasal Steroid Spray - Start Atrovent (Ipratropium Bromide) 1-2 sprays in each nostril up to 3 times a day as needed for runny nose/post nasal drip/drainage.   - Start Allergy Eye drops -  Continue Singulair (Montelukast) 10mg  daily (57yo+), 5mg  daily (6 and up), 4mg  daily (1-5yo) - Continue over the counter antihistamine daily or daily as needed.  Tree Nut Allergy: Stable - please strictly avoid tree nuts, will update nut testing via blood draw today - for SKIN only reaction, okay to take Benadryl 2 capsules every 4 hours - for SKIN + ANY additional symptoms, OR IF concern for LIFE THREATENING reaction = Epipen Autoinjector AuviQ 0.3 mg. - If using Epinephrine autoinjector, call 911 - A food allergy action plan has been provided and discussed. - Medic  Alert identification is recommended.  Follow-up in 4 to 6 weeks This note in its entirety was forwarded to the Provider who requested this consultation.  Thank you for your kind referral. I appreciate the opportunity to take part in Alexus's care. Please do not hesitate to contact me with questions.  Sincerely,  Tonny Bollman, MD [facility]

## 2020-12-17 NOTE — Patient Instructions (Addendum)
Severe Persistent Asthma-uncontrolled: - your breathing test today shows moderate obstruction with significant response to bronchodilator which is consistent with asthma - stop symbicort - Start Breztri 2 puffs twice a day with a spacer; THIS SHOULD BE USED EVERY DAY - Rinse mouth out after use - Use Albuterol (Proair/Ventolin) 2 puffs every 4-6 hours as needed for chest tightness, wheezing, or coughing and Albuterol (Proair/Ventolin) 2 puffs 15 minutes prior to exercise if you have symptoms with activity - Use a spacer with all inhalers - please keep track of how often you are needing rescue inhaler Albuterol (Proair/Ventolin) as this will help guide future management - Asthma is not controlled if:  - Symptoms are occurring >2 times a week OR  - >2 times a month nighttime awakenings  - Please call the clinic to schedule a follow up if these symptoms arise -Labs today: IgE, environmental panel, and CBCd for consideration of asthma biologics  Allergic Rhinitis-seasonal and perennial: - will update your environmental panel with a blood test - last skin test 2018: positive to ash, maple, alternaria, bipolaris, dreschlera, and cockroach  - allergen avoidance as below - consider allergy shots as long term control of your symptoms by teaching your immune system to be more tolerant of your allergy triggers - Start Nasal Steroid Spray: Options include Flonase (fluticasone), Nasocort (triamcinolone), Nasonex (mometasome) 1- 2 sprays in each nostril daily (can buy over-the-counter if not covered by insurance)  Best results if used daily. - Start Atrovent (Ipratropium Bromide) 1-2 sprays in each nostril up to 3 times a day as needed for runny nose/post nasal drip/drainage.   - Start Allergy Eye drops: great options include Pataday (Olopatadine) or Zaditor (ketotifen) for eye symptoms daily as needed-both sold over the counter if not covered by insurance.   -Avoid eye drops that say red eye relief as they  may contain medications that dry out your eyes. - Continue Singulair (Montelukast) 10mg  daily (57yo+), 5mg  daily (6 and up), 4mg  daily (1-5yo) - Continue over the counter antihistamine daily or daily as needed.  Your options include Zyrtec (Cetirizine) 10mg , Claritin (Loratadine) 10mg , Allegra (Fexofenadine) 180mg , or Xyzal (Levocetirinze) 5mg   Tree Nut Allergy:  - please strictly avoid tree nuts - for SKIN only reaction, okay to take Benadryl 2 capsules every 4 hours - for SKIN + ANY additional symptoms, OR IF concern for LIFE THREATENING reaction = Epipen Autoinjector AuviQ 0.3 mg. - If using Epinephrine autoinjector, call 911 - A food allergy action plan has been provided and discussed. - Medic Alert identification is recommended.  Follow-up in 4 to 6 weeks for asthma control.

## 2020-12-24 LAB — ALLERGENS(7)
Brazil Nut IgE: <0.1 kU/L
F020-IgE Almond: <0.1 kU/L
F202-IgE Cashew Nut: <0.1 kU/L
Hazelnut (Filbert) IgE: <0.1 kU/L
Peanut IgE: <0.1 kU/L
Pecan Nut IgE: <0.1 kU/L
Walnut IgE: <0.1 kU/L

## 2020-12-24 LAB — CBC WITH DIFFERENTIAL/PLATELET
Basophils Absolute: 0 10*3/uL (ref 0.0–0.2)
Basos: 0 %
EOS (ABSOLUTE): 0.1 10*3/uL (ref 0.0–0.4)
Eos: 2 %
Hematocrit: 39 % (ref 34.0–46.6)
Hemoglobin: 12.9 g/dL (ref 11.1–15.9)
Immature Grans (Abs): 0 10*3/uL (ref 0.0–0.1)
Immature Granulocytes: 0 %
Lymphocytes Absolute: 1.8 10*3/uL (ref 0.7–3.1)
Lymphs: 33 %
MCH: 30.9 pg (ref 26.6–33.0)
MCHC: 33.1 g/dL (ref 31.5–35.7)
MCV: 93 fL (ref 79–97)
Monocytes Absolute: 0.5 10*3/uL (ref 0.1–0.9)
Monocytes: 10 %
Neutrophils Absolute: 3 10*3/uL (ref 1.4–7.0)
Neutrophils: 55 %
Platelets: 251 10*3/uL (ref 150–450)
RBC: 4.18 x10E6/uL (ref 3.77–5.28)
RDW: 12.6 % (ref 11.7–15.4)
WBC: 5.4 10*3/uL (ref 3.4–10.8)

## 2020-12-24 LAB — ALLERGENS, ZONE 2
Alternaria Alternata IgE: 0.17 kU/L — AB
Amer Sycamore IgE Qn: <0.1 kU/L
Aspergillus Fumigatus IgE: 0.16 kU/L — AB
Bahia Grass IgE: <0.1 kU/L
Bermuda Grass IgE: <0.1 kU/L
Cat Dander IgE: <0.1 kU/L
Cedar, Mountain IgE: <0.1 kU/L
Cladosporium Herbarum IgE: <0.1 kU/L
Cockroach, American IgE: <0.1 kU/L
Common Silver Birch IgE: 0.26 kU/L — AB
D Farinae IgE: <0.1 kU/L
D Pteronyssinus IgE: <0.1 kU/L
Dog Dander IgE: <0.1 kU/L
Elm, American IgE: 0.11 kU/L — AB
Hickory, White IgE: <0.1 kU/L
Johnson Grass IgE: <0.1 kU/L
Maple/Box Elder IgE: <0.1 kU/L
Mucor Racemosus IgE: <0.1 kU/L
Mugwort IgE Qn: <0.1 kU/L
Nettle IgE: <0.1 kU/L
Oak, White IgE: <0.1 kU/L
Penicillium Chrysogen IgE: 0.12 kU/L — AB
Pigweed, Rough IgE: <0.1 kU/L
Plantain, English IgE: <0.1 kU/L
Ragweed, Short IgE: <0.1 kU/L
Sheep Sorrel IgE Qn: <0.1 kU/L
Stemphylium Herbarum IgE: 0.15 kU/L — AB
Sweet gum IgE RAST Ql: <0.1 kU/L
Timothy Grass IgE: <0.1 kU/L
White Mulberry IgE: <0.1 kU/L

## 2020-12-24 LAB — IGE: IgE (Immunoglobulin E), Serum: 38 IU/mL (ref 6–495)

## 2020-12-24 NOTE — Progress Notes (Signed)
Please let Mrs. Bunyan know that her testing to environmental allergies was negative (positive is greater than 0.35).  Based on these results do not be a candidate for shots.  She may be a candidate for test by her which is in injectable medication for her asthma, but we can discuss this in follow-up depending on her response to her new inhaler.  Additionally, her peanut and tree nut panel was completely negative.  If she is interested in eating tree nuts again, we can set her up for an oral challenge.  I look forward to seeing her in follow-up.

## 2021-01-26 NOTE — Progress Notes (Deleted)
FOLLOW UP Date of Service/Encounter:  01/26/21   Subjective:  Toni Hughes (DOB: 1965-09-04) is a 55 y.o. female who [facility] in re-evaluation of the following: *** History obtained from: chart review and {Persons; PED relatives w/patient:19415::"patient"}.  For Review, LV was on 12/17/2020 with Dr.Oneill Bais seen for severe persistent asthma (switch from Symbicort 160 to Breztri 2 puffs twice daily, and labs for asthma Biologics are obtained).  She would be a candidate for anti-IL5, tezspire, or dupixent based on testing.  Plan to follow-up response to [facility] and decide on biologics as next step. Allergic rhinitis-blood testing showed no allergies.  Last positive testing in 2018.  Started steroid spray and Atrovent as well as Singulair. She also has a history of tree nuts, but blood testing was negative for these as well.  Discussed oral challenges interested.  Previous diagnostics:  SPT 2018: environmental positive to ash, maple, alternaria, bipolaris, dreschlera, and (IDs):cockroach  Most recent CT: 05/14/18-IMPRESSION:  1. Stable findings consistent with chronic sarcoidosis.  2. No active lung disease or other acute findings.  Most recent Echo 11/03/20: -normal echocardiogram Left Ventricle: Systolic function is normal. EF: 55-60%.    Aortic Valve: The aortic valve is tricuspid. The leaflets exhibit  normal excursion.    Aortic Valve: There is no regurgitation.    Mitral Valve: There is mild regurgitation.    Tricuspid Valve: There is mild regurgitation.    Tricuspid Valve: The right ventricular systolic pressure is normal (<36  mmHg). -Spirometry 12/17/2020-69% ratio, 63% FEV1 with 12% increase postbronchodilator.  Noted to have poor technique when using albuterol for bronchodilator response.  Educated in real-time. -Blood test: 12/17/2020: Tree nut panel negative, total IgE 38, negative environmental allergy profile, absolute eosinophil  count 100 (162 on 12/21/2020)    Allergies as of 01/27/2021       Reactions   Codeine Hives   Paranoid   Diphenhydramine Hcl Hives   Other Hives, Itching   Peanuts   Terbinafine Hives, Itching   Skin discoloration   Lamisil  [terbinafine] Hives   Penicillins Hives        Medication List        Accurate as of January 26, 2021  8:48 AM. If you have any questions, ask your nurse or doctor.          albuterol 108 (90 Base) MCG/ACT inhaler Commonly known as: Ventolin HFA 2 puffs every 4 hours if needed for cough or wheeze.   AMBULATORY NON FORMULARY MEDICATION Rolling Knee Scooter, please take Rx to medical supply store.   amLODipine 10 MG tablet Commonly known as: NORVASC Take by mouth.   aspirin 81 MG chewable tablet Chew by mouth.   Breztri Aerosphere 160-9-4.8 MCG/ACT Aero Generic drug: Budeson-Glycopyrrol-Formoterol Inhale 2 puffs into the lungs in the morning and at bedtime.   budesonide-formoterol 160-4.5 MCG/ACT inhaler Commonly known as: Symbicort Inhale 2 puffs into the lungs 2 (two) times daily.   carvedilol 6.25 MG tablet Commonly known as: COREG Take 6.25 mg by mouth 2 (two) times daily.   cetirizine 10 MG tablet Commonly known as: ZYRTEC Take by mouth.   EPINEPHrine 0.3 mg/0.3 mL Soaj injection Commonly known as: Auvi-Q Use as directed for severe allergic reaction.   Fluticasone Propionate 93 MCG/ACT Exhu Commonly known as: Xhance Place 2 sprays into the nose 2 (two) times daily.   hydrALAZINE 25 MG tablet Commonly known as: APRESOLINE Take by mouth.   ibuprofen 600 MG tablet  Commonly known as: ADVIL Take 1 tablet (600 mg total) by mouth every 6 (six) hours as needed.   ipratropium 0.03 % nasal spray Commonly known as: ATROVENT Place 2 sprays into both nostrils 3 (three) times daily.   isosorbide mononitrate 60 MG 24 hr tablet Commonly known as: IMDUR Take by mouth.   losartan-hydrochlorothiazide 100-12.5 MG tablet Commonly  known as: HYZAAR Take 1 tablet by mouth daily.   meloxicam 7.5 MG tablet Commonly known as: MOBIC Take 7.5 mg by mouth daily.   metoprolol tartrate 25 MG tablet Commonly known as: LOPRESSOR Take by mouth.   montelukast 10 MG tablet Commonly known as: SINGULAIR Take 10 mg by mouth daily.   mupirocin ointment 2 % Commonly known as: BACTROBAN Apply topically.   nitroGLYCERIN 0.4 mg/hr patch Commonly known as: NITRODUR - Dosed in mg/24 hr Place 0.4 mg onto the skin daily.   Oncovite Tabs Take by mouth.   pantoprazole 40 MG tablet Commonly known as: PROTONIX Take 40 mg by mouth daily.   ranolazine 500 MG 12 hr tablet Commonly known as: RANEXA Take by mouth.   rosuvastatin 40 MG tablet Commonly known as: CRESTOR Take by mouth.       Past Medical History:  Diagnosis Date   Allergy    Anxiety    Asthma    Heart disease    Hyperlipidemia    Hypertension    Recurrent upper respiratory infection (URI)    Sarcoidosis    Urticaria    Past Surgical History:  Procedure Laterality Date   BREAST LUMPECTOMY Right 10/2015   CARDIAC CATHETERIZATION  04/2015   Otherwise, there have been no changes to her past medical history, surgical history, family history, or social history.  ROS: All others negative except as noted per HPI.   Objective:  There were no vitals taken for this visit. There is no height or weight on file to calculate BMI. Physical Exam: General Appearance:  Alert, cooperative, no distress, appears stated age  Head:  Normocephalic, without obvious abnormality, atraumatic  Eyes:  Conjunctiva clear, EOM's intact  Nose: Nares normal  Throat: Lips, tongue normal; teeth and gums normal  Neck: Supple, symmetrical  Lungs:   Respirations unlabored, no coughing  Heart:  Appears well perfused  Extremities: No edema  Skin: Skin color, texture, turgor normal, no rashes or lesions on visualized portions of skin  Neurologic: No gross deficits   Reviewed:  ***  Spirometry:  Tracings reviewed. Her effort: {Blank single:19197::"Good reproducible efforts.","It was hard to get consistent efforts and there is a question as to whether this reflects a maximal maneuver.","Poor effort, data can not be interpreted."} FVC: ***L FEV1: ***L, ***% predicted FEV1/FVC ratio: ***% Interpretation: {Blank single:19197::"Spirometry consistent with mild obstructive disease","Spirometry consistent with moderate obstructive disease","Spirometry consistent with severe obstructive disease","Spirometry consistent with possible restrictive disease","Spirometry consistent with mixed obstructive and restrictive disease","Spirometry uninterpretable due to technique","Spirometry consistent with normal pattern","No overt abnormalities noted given today's efforts"}.  Please see scanned spirometry results for details.  Skin Testing: {Blank single:19197::"Select foods","Environmental allergy panel","Environmental allergy panel and select foods","Food allergy panel","None","Deferred due to recent antihistamines use"}. Positive test to: ***. Negative test to: ***.  Results discussed with patient/family.   {Blank single:19197::"Allergy testing results were read and interpreted by myself, documented by clinical staff."," "}  Assessment/Plan  There are no Patient Instructions on file for this visit.  No follow-ups on file.  Tonny Bollman, MD  [facility]

## 2021-01-27 ENCOUNTER — Ambulatory Visit: Payer: Medicare Other | Admitting: Internal Medicine

## 2021-03-10 NOTE — Progress Notes (Signed)
FOLLOW UP Date of Service/Encounter:  03/11/21  Subjective:  Toni Hughes (DOB: [birth year]) is a 56 y.o. female with a history of sarcoidosis and chronic interstitial lung disease with CAD, hypertension followed by pulmonology who returns to the [facility] on 03/11/2021 in re-evaluation of the following: asthma, allergic rhinitis, and tree nut allergy. History obtained from: chart review and patient.  For Review, LV was on 12/17/2020 with Dr.Khoi Hamberger.  She was encouraged to start her Judithann Sauger which was prescribed by pulmonary.  We obtained labs to see her candidacy for biologics.  We updated her allergy testing via blood work as she has severe needle phobia and refused skin testing.   Pertinent history/diagnostics: Chronic rhinitis:  -she has been on allergy shots at a separate clinic around 2016 but only did them for a month SPT 2018: environmental positive to ash, maple, alternaria, bipolaris, dreschlera, and (IDs):cockroach  -Blood testing 12/17/2020, negative environmental panel Moderate persistent asthma: -  Most recent CT: 05/14/18-IMPRESSION:  1. Stable findings consistent with chronic sarcoidosis.  2. No active lung disease or other acute findings.   -Most recent Echo 11/03/20: -normal echocardiogram Left Ventricle: Systolic function is normal. EF: 55-60%.    Aortic Valve: The aortic valve is tricuspid. The leaflets exhibit  normal excursion.    Aortic Valve: There is no regurgitation.    Mitral Valve: There is mild regurgitation.    Tricuspid Valve: There is mild regurgitation.    Tricuspid Valve: The right ventricular systolic pressure is normal (<36  mmHg). - Prepost spirometry 12/17/2020: Ratio 69%, FEV1 63% with 12% improvement in FEV1 on post - Poor technique noted on albuterol given in clinic -12/17/2020, IgE 38, AEC 100, Repeat AEC 12/21/2020 162 Tree nut allergy: - Tree nut and peanut panel negative on blood testing  12/17/2020 ----------------------------------------------------------------------------------------------------- Today presents for follow-up. She has been doing okay, but her left hip is bothering her.  She is doing PT which she tolerates without any issues with her breathing.Marland Kitchen Her breathing was much improved until the weather changed.  For the most part she is doing well, but every once in a while will feel like her asthma is slightly flared. She has not used any rescue meds in the past few months. She is using Breztri as one puff twice daily.  She does report that at one point she was on both Symbicort and Breztri together for about 2 weeks as she thought it might help.  She had heart flutters, so she stopped.  Has only been off both combined for one month.  She does not feel any worsening in her breathing since stopping both. Discussed that these are not meant to be given together. Otherwise she is doing well.  Rhinitis symptoms are overall controlled.  Continues to avoid tree nuts.  Not interested in oral challenge.   Allergies as of 03/11/2021       Reactions   Codeine Hives   Paranoid   Diphenhydramine Hcl Hives   Other Hives, Itching   Peanuts   Terbinafine Hives, Itching   Skin discoloration   Lamisil  [terbinafine] Hives   Penicillins Hives        Medication List        Accurate as of March 11, 2021  1:38 PM. If you have any questions, ask your nurse or doctor.          STOP taking these medications    AMBULATORY NON FORMULARY MEDICATION Stopped by: Sigurd Sos, MD   Arnell Sieving  160-9-4.8 MCG/ACT Aero Generic drug: Budeson-Glycopyrrol-Formoterol Stopped by: Sigurd Sos, MD       TAKE these medications    albuterol 108 (90 Base) MCG/ACT inhaler Commonly known as: Ventolin HFA 2 puffs every 4 hours if needed for cough or wheeze.   amLODipine 10 MG tablet Commonly known as: NORVASC Take by mouth.   aspirin 81 MG chewable tablet Chew by  mouth.   azithromycin 250 MG tablet Commonly known as: Zithromax Z-Pak Take 2 tablets on day 1, then take 1 tablet on days 2 through 4. Started by: Sigurd Sos, MD   budesonide-formoterol 160-4.5 MCG/ACT inhaler Commonly known as: Symbicort Inhale 2 puffs into the lungs 2 (two) times daily.   carvedilol 6.25 MG tablet Commonly known as: COREG Take 6.25 mg by mouth 2 (two) times daily.   cetirizine 10 MG tablet Commonly known as: ZYRTEC Take by mouth.   EPINEPHrine 0.3 mg/0.3 mL Soaj injection Commonly known as: Auvi-Q Use as directed for severe allergic reaction.   Fluticasone Propionate 93 MCG/ACT Exhu Commonly known as: Xhance Place 2 sprays into the nose 2 (two) times daily.   hydrALAZINE 25 MG tablet Commonly known as: APRESOLINE Take by mouth.   ibuprofen 600 MG tablet Commonly known as: ADVIL Take 1 tablet (600 mg total) by mouth every 6 (six) hours as needed.   ipratropium 0.03 % nasal spray Commonly known as: ATROVENT Place 2 sprays into both nostrils 3 (three) times daily.   isosorbide mononitrate 60 MG 24 hr tablet Commonly known as: IMDUR Take by mouth.   losartan-hydrochlorothiazide 100-12.5 MG tablet Commonly known as: HYZAAR Take 1 tablet by mouth daily.   meloxicam 7.5 MG tablet Commonly known as: MOBIC Take 7.5 mg by mouth daily.   metoprolol tartrate 25 MG tablet Commonly known as: LOPRESSOR Take by mouth.   montelukast 10 MG tablet Commonly known as: SINGULAIR Take 10 mg by mouth daily.   mupirocin ointment 2 % Commonly known as: BACTROBAN Apply topically.   nitroGLYCERIN 0.4 mg/hr patch Commonly known as: NITRODUR - Dosed in mg/24 hr Place 0.4 mg onto the skin daily.   Oncovite Tabs Take by mouth.   pantoprazole 40 MG tablet Commonly known as: PROTONIX Take 40 mg by mouth daily.   ranolazine 500 MG 12 hr tablet Commonly known as: RANEXA Take by mouth.   rosuvastatin 40 MG tablet Commonly known as: CRESTOR Take by  mouth.       Past Medical History:  Diagnosis Date   Allergy    Anxiety    Asthma    Heart disease    Hyperlipidemia    Hypertension    Recurrent upper respiratory infection (URI)    Sarcoidosis    Urticaria    Past Surgical History:  Procedure Laterality Date   BREAST LUMPECTOMY Right 10/2015   CARDIAC CATHETERIZATION  04/2015   Otherwise, there have been no changes to her past medical history, surgical history, family history, or social history.  ROS: All others negative except as noted per HPI.   Objective:  BP 124/74    Pulse 85    Temp 98.6 F (37 C) (Temporal)    Resp 18    SpO2 97%  There is no height or weight on file to calculate BMI. Physical Exam: General Appearance:  Alert, cooperative, no distress, appears stated age  Head:  Normocephalic, without obvious abnormality, atraumatic  Eyes:  Conjunctiva clear, EOM's intact  Nose: Nares normal, normal mucosa  Throat: Lips, tongue normal; teeth and  gums normal, normal posterior oropharynx  Neck: Supple, symmetrical  Lungs:   Prolonged expiratory phase and clear to auscultation bilaterally, Respirations unlabored, no coughing  Heart:  regular rate and rhythm and no murmur, Appears well perfused  Extremities: No edema  Skin: Skin color, texture, turgor normal, no rashes or lesions on visualized portions of skin  Neurologic: No gross deficits  Spirometry:  Tracings reviewed. Her effort: Good reproducible efforts. FVC: 2.31L FEV1: 1.45L, 70% predicted FEV1/FVC ratio: 79% Interpretation: Spirometry consistent with mild obstructive disease.  Please see scanned spirometry results for details.  Assessment/Plan   Patient Instructions  Severe Persistent Asthma-improved: - your breathing test today looks better than last time - Continue Breztri 1 puffs twice a day with a spacer; THIS SHOULD BE USED EVERY DAY For Flares or Respiratory Illness: increase Breztri to 2 puffs twice a day and use for 2 weeks or until  symptoms resolve - Rinse mouth out after use - Use Albuterol (Proair/Ventolin) 2 puffs every 4-6 hours as needed for chest tightness, wheezing, or coughing and Albuterol (Proair/Ventolin) 2 puffs 15 minutes prior to exercise if you have symptoms with activity  - Asthma is not controlled if:  - Symptoms are occurring >2 times a week OR  - >2 times a month nighttime awakenings  - Please call the clinic to schedule a follow up if these symptoms arise   Chronic rhinitis with acute sinusitis - recent lab showed negative environmental panel - Continue Nasal Steroid Spray: Options include Flonase (fluticasone), Nasocort (triamcinolone), Nasonex (mometasome) 1- 2 sprays in each nostril daily (can buy over-the-counter if not covered by insurance)  - Continue Atrovent (Ipratropium Bromide) 1-2 sprays in each nostril up to 3 times a day as needed for runny nose/post nasal drip/drainage.   - Continue Allergy Eye drops - Continue Singulair (Montelukast) 10mg  daily  - Continue over the counter antihistamine daily or daily as needed.  Your options include Zyrtec (Cetirizine) 10mg , Claritin (Loratadine) 10mg , Allegra (Fexofenadine) 180mg , or Xyzal (Levocetirinze) 5mg  - Z-pack has been sent to your pharmacy   Tree Nut Allergy:  - labs negative for tree nuts and peanut - please strictly avoid tree nuts until ready to undergo oral challenge - for SKIN only reaction, okay to take Benadryl 2 capsules every 4 hours - for SKIN + ANY additional symptoms, OR IF concern for LIFE THREATENING reaction = Epipen Autoinjector AuviQ 0.3 mg. - If using Epinephrine autoinjector, call 911 - A food allergy action plan has been provided and discussed. - Medic Alert identification is recommended.  Follow-up in 4 months, sooner if needed.   Sigurd Sos, MD  [facility]

## 2021-03-11 ENCOUNTER — Encounter: Payer: Self-pay | Admitting: Internal Medicine

## 2021-03-11 ENCOUNTER — Other Ambulatory Visit: Payer: Self-pay

## 2021-03-11 ENCOUNTER — Ambulatory Visit (INDEPENDENT_AMBULATORY_CARE_PROVIDER_SITE_OTHER): Payer: Medicare Other | Admitting: Internal Medicine

## 2021-03-11 VITALS — BP 124/74 | HR 85 | Temp 98.6°F | Resp 18

## 2021-03-11 DIAGNOSIS — J309 Allergic rhinitis, unspecified: Secondary | ICD-10-CM | POA: Diagnosis not present

## 2021-03-11 DIAGNOSIS — J4550 Severe persistent asthma, uncomplicated: Secondary | ICD-10-CM

## 2021-03-11 MED ORDER — BREZTRI AEROSPHERE 160-9-4.8 MCG/ACT IN AERO: 2.0000 | INHALATION_SPRAY | Freq: Two times a day (BID) | RESPIRATORY_TRACT | 3 refills | Status: DC

## 2021-03-11 MED ORDER — AZITHROMYCIN 250 MG PO TABS: ORAL_TABLET | ORAL | 0 refills | Status: DC

## 2021-03-11 NOTE — Patient Instructions (Addendum)
Severe Persistent Asthma-improved: - your breathing test today looks better than last time - Continue Breztri 1 puffs twice a day with a spacer; THIS SHOULD BE USED EVERY DAY For Flares or Respiratory Illness: increase Breztri to 2 puffs twice a day and use for 2 weeks or until symptoms resolve - Rinse mouth out after use - Use Albuterol (Proair/Ventolin) 2 puffs every 4-6 hours as needed for chest tightness, wheezing, or coughing and Albuterol (Proair/Ventolin) 2 puffs 15 minutes prior to exercise if you have symptoms with activity  - Asthma is not controlled if:  - Symptoms are occurring >2 times a week OR  - >2 times a month nighttime awakenings  - Please call the clinic to schedule a follow up if these symptoms arise   Chronic rhinitis with acute sinusitis - recent lab showed negative environmental panel - Continue Nasal Steroid Spray: Options include Flonase (fluticasone), Nasocort (triamcinolone), Nasonex (mometasome) 1- 2 sprays in each nostril daily (can buy over-the-counter if not covered by insurance)  - Continue Atrovent (Ipratropium Bromide) 1-2 sprays in each nostril up to 3 times a day as needed for runny nose/post nasal drip/drainage.   - Continue Allergy Eye drops - Continue Singulair (Montelukast) 10mg  daily  - Continue over the counter antihistamine daily or daily as needed.  Your options include Zyrtec (Cetirizine) 10mg , Claritin (Loratadine) 10mg , Allegra (Fexofenadine) 180mg , or Xyzal (Levocetirinze) 5mg  - Z-pack has been sent to your pharmacy   Tree Nut Allergy:  - labs negative for tree nuts and peanut - please strictly avoid tree nuts until ready to undergo oral challenge - for SKIN only reaction, okay to take Benadryl 2 capsules every 4 hours - for SKIN + ANY additional symptoms, OR IF concern for LIFE THREATENING reaction = Epipen Autoinjector AuviQ 0.3 mg. - If using Epinephrine autoinjector, call 911 - A food allergy action plan has been provided and  discussed. - Medic Alert identification is recommended.  Follow-up in 4 months, sooner if needed.  It was a pleasure seeing you again in clinic today! If you receive a survey about today's experience, please consider giving feedback on how we're doing!  , MD [facility]

## 2021-07-08 NOTE — Progress Notes (Signed)
FOLLOW UP Date of Service/Encounter:  07/11/21   Subjective:  Toni Hughes (DOB: 27-Apr-1965) is a 56 y.o. female with PMHx of CAD, HTN, ischemic cardiomyopathy, sacroidosis, bronchiectasis, hx of hypersensitivity pneumonia who [facility] in re-evaluation of the following: Asthma, chronic rhinitis, adverse food reaction History obtained from: chart review and patient.  For Review, LV was on 03/11/21  with Dr.Shaquille Murdy seen for regular follow-up.  Her asthma was well-controlled on Breztri 1 puff BID.  She admitted to using Breztri and Symbicort simultaneously for a brief stent, but developed palpitations. Discussed that these meds are not meant to be given together. Her FEV1 was 70% at last visit. She was exhibiting signs/symptoms of acute sinusitis so we treated her with a z-pack.    She has seen her cardiologist since last visit on 05/13/21 and no changes were made to her medications.  FU 6 months.  On 07/07/21: seen for acute fall asn asthma exacerbation in setting of viral illness, treated with ceftin and zithromax, pulmicort 0.5 were added BID to current regimen.  CXR 07/07/21:  1.  No radiographic evidence of acute cardiac or pulmonary abnormality.  2.  Coarsening of the interstitium in the right lung could be due to scattered the scar/fibrosis or aspiration.  Pertinent history/diagnostics: Chronic rhinitis:  -she has been on allergy shots at a separate clinic around 2016 but only did them for a month, has severe needle phobia SPT 2018: environmental positive to ash, maple, alternaria, bipolaris, dreschlera, and (IDs):cockroach  -Blood testing 12/17/2020, negative environmental panel Moderate persistent asthma: -  Most recent CT: 05/14/18-IMPRESSION:  1. Stable findings consistent with chronic sarcoidosis.  2. No active lung disease or other acute findings.   -Most recent Echo 11/03/20: -normal echocardiogram Left Ventricle: Systolic  function is normal. EF: 55-60%.    Aortic Valve: The aortic valve is tricuspid. The leaflets exhibit  normal excursion.    Aortic Valve: There is no regurgitation.    Mitral Valve: There is mild regurgitation.    Tricuspid Valve: There is mild regurgitation.    Tricuspid Valve: The right ventricular systolic pressure is normal (<36  mmHg). - Prepost spirometry 12/17/2020: Ratio 69%, FEV1 63% with 12% improvement in FEV1 on post - Poor technique noted when albuterol given in clinic -12/17/2020, IgE 38, AEC 100, Repeat AEC 12/21/2020 162 Tree nut allergy: - Tree nut and peanut panel negative on blood testing 12/17/2020; she is NOT interested in an oral challenge  Today presents for follow-up. Asthma-her asthma has been well controlled until last week.  She developed increased coughing and wheezing; was seen by her primary care.  She was started on 2 antibiotics (cefdinir and azithromycin) for possible community-acquired pneumonia.  Pulmicort was added to her asthma action plan which she was instructed to continue for 1 week.  She was only using Breztri 1 puff twice a day, and was told by PCP to increase to 2 puffs twice a day when she was finished with her 1 week of Pulmicort. Chronic rhinitis: Continues to use nasal sprays.  She got her heating and air fixed and now air has moisture in her air which has helped with her symptoms dramatically.   Tree nut avoidance: previous testing negative, she is avoiding.  Allergies as of 07/11/2021       Reactions   Codeine Hives   Paranoid   Diphenhydramine Hcl Hives   Other Hives, Itching   Peanuts, tree nuts   Terbinafine Hives, Itching  Skin discoloration   Lamisil  [terbinafine] Hives   Penicillins Hives        Medication List        Accurate as of Jul 11, 2021  5:53 PM. If you have any questions, ask your nurse or doctor.          albuterol 108 (90 Base) MCG/ACT inhaler Commonly known as: Ventolin HFA 2 puffs every 4 hours if  needed for cough or wheeze.   amLODipine 10 MG tablet Commonly known as: NORVASC Take by mouth.   aspirin 81 MG chewable tablet Chew by mouth.   azithromycin 250 MG tablet Commonly known as: Zithromax Z-Pak Take 2 tablets on day 1, then take 1 tablet on days 2 through 4.   Breztri Aerosphere 160-9-4.8 MCG/ACT Aero Generic drug: Budeson-Glycopyrrol-Formoterol Inhale 2 puffs into the lungs in the morning and at bedtime.   carvedilol 6.25 MG tablet Commonly known as: COREG Take 6.25 mg by mouth 2 (two) times daily.   cetirizine 10 MG tablet Commonly known as: ZYRTEC Take by mouth.   EPINEPHrine 0.3 mg/0.3 mL Soaj injection Commonly known as: Auvi-Q Use as directed for severe allergic reaction.   Fluticasone Propionate 93 MCG/ACT Exhu Commonly known as: Xhance Place 2 sprays into the nose 2 (two) times daily.   hydrALAZINE 25 MG tablet Commonly known as: APRESOLINE Take by mouth.   ibuprofen 600 MG tablet Commonly known as: ADVIL Take 1 tablet (600 mg total) by mouth every 6 (six) hours as needed.   ipratropium 0.03 % nasal spray Commonly known as: ATROVENT Place 2 sprays into both nostrils 3 (three) times daily.   isosorbide mononitrate 60 MG 24 hr tablet Commonly known as: IMDUR Take by mouth.   losartan-hydrochlorothiazide 100-12.5 MG tablet Commonly known as: HYZAAR Take 1 tablet by mouth daily.   meloxicam 7.5 MG tablet Commonly known as: MOBIC Take 7.5 mg by mouth daily.   metoprolol tartrate 25 MG tablet Commonly known as: LOPRESSOR Take by mouth.   montelukast 10 MG tablet Commonly known as: SINGULAIR Take 10 mg by mouth daily.   mupirocin ointment 2 % Commonly known as: BACTROBAN Apply topically.   nitroGLYCERIN 0.4 mg/hr patch Commonly known as: NITRODUR - Dosed in mg/24 hr Place 0.4 mg onto the skin daily.   Oncovite Tabs Take by mouth.   pantoprazole 40 MG tablet Commonly known as: PROTONIX Take 40 mg by mouth daily.   ranolazine  500 MG 12 hr tablet Commonly known as: RANEXA Take by mouth.   rosuvastatin 40 MG tablet Commonly known as: CRESTOR Take by mouth.       Past Medical History:  Diagnosis Date   Allergy    Anxiety    Asthma    Heart disease    Hyperlipidemia    Hypertension    Recurrent upper respiratory infection (URI)    Sarcoidosis    Urticaria    Past Surgical History:  Procedure Laterality Date   BREAST LUMPECTOMY Right 10/2015   CARDIAC CATHETERIZATION  04/2015   Otherwise, there have been no changes to her past medical history, surgical history, family history, or social history.  ROS: All others negative except as noted per HPI.   Objective:  BP 130/78   Pulse 90   Temp 99.1 F (37.3 C) (Temporal)   Resp 17   Ht 5\' 2"  (1.575 m)   Wt 159 lb 3.2 oz (72.2 kg)   SpO2 98%   BMI 29.12 kg/m  Body mass index is  29.12 kg/m. Physical Exam: General Appearance:  Alert, cooperative, no distress, appears stated age  Head:  Normocephalic, without obvious abnormality, atraumatic  Eyes:  Conjunctiva clear, EOM's intact  Nose: Nares normal, normal mucosa and no visible anterior polyps  Throat: Lips, tongue normal; teeth and gums normal, normal posterior oropharynx  Neck: Supple, symmetrical  Lungs:   clear to auscultation bilaterally, Respirations unlabored, no coughing  Heart:  regular rate and rhythm and no murmur, Appears well perfused  Extremities: No edema  Skin: Skin color, texture, turgor normal, no rashes or lesions on visualized portions of skin  Neurologic: No gross deficits    Spirometry:  Tracings reviewed. Her effort: Good reproducible efforts. FVC: 2.03L FEV1: 1.29L, 62% predicted FEV1/FVC ratio: 80% Interpretation: Spirometry consistent with moderate obstructive disease.  Please see scanned spirometry results for details.   Assessment/Plan   Severe Persistent Asthma - your breathing test today showed moderate obstruction - Continue Breztri 1 puffs twice a  day with a spacer; THIS SHOULD BE USED EVERY DAY  For Flares or Respiratory Illness: increase Breztri to 2 puffs twice a day and use for 2 weeks or until symptoms resolve, if no improvement after 2 to 3 days can add Pulmicort 0.5 mg via nebulizer for 1 to 2 weeks - Rinse mouth out after use - Use Albuterol (Proair/Ventolin) 2 puffs every 4-6 hours as needed for chest tightness, wheezing, or coughing and Albuterol (Proair/Ventolin) 2 puffs 15 minutes prior to exercise if you have symptoms with activity  - Asthma is not controlled if:  - Symptoms are occurring >2 times a week OR  - >2 times a month nighttime awakenings  - Please call the clinic to schedule a follow up if these symptoms arise   Chronic rhinitis  - recent lab showed negative environmental panel - Continue Nasal Steroid Spray: Options include Flonase (fluticasone), Nasocort (triamcinolone), Nasonex (mometasome) 1- 2 sprays in each nostril daily (can buy over-the-counter if not covered by insurance)  - Continue Atrovent (Ipratropium Bromide) 1-2 sprays in each nostril up to 3 times a day as needed for runny nose/post nasal drip/drainage.   - Continue Allergy Eye drops - Continue Singulair (Montelukast) 10mg  daily  - Continue over the counter antihistamine daily or daily as needed.  Your options include Zyrtec (Cetirizine) 10mg , Claritin (Loratadine) 10mg , Allegra (Fexofenadine) 180mg , or Xyzal (Levocetirinze) 5mg  - Z-pack has been sent to your pharmacy   Tree Nut Allergy:  - labs negative for tree nuts and peanut - please strictly avoid tree nuts until ready to undergo oral challenge - for SKIN only reaction, okay to take Benadryl 2 capsules every 4 hours - for SKIN + ANY additional symptoms, OR IF concern for LIFE THREATENING reaction = Epipen Autoinjector AuviQ 0.3 mg. - If using Epinephrine autoinjector, call 911 - A food allergy action plan has been provided and discussed. - Medic Alert identification is  recommended.  Follow-up in 6 months, sooner if needed.  It was a pleasure seeing you again in clinic today!   Tonny BollmanErin [place], MD  [facility]

## 2021-07-11 ENCOUNTER — Encounter: Payer: Self-pay | Admitting: Internal Medicine

## 2021-07-11 ENCOUNTER — Ambulatory Visit (INDEPENDENT_AMBULATORY_CARE_PROVIDER_SITE_OTHER): Payer: Medicare Other | Admitting: Internal Medicine

## 2021-07-11 VITALS — BP 130/78 | HR 90 | Temp 99.1°F | Resp 17 | Ht 62.0 in | Wt 159.2 lb

## 2021-07-11 DIAGNOSIS — J4550 Severe persistent asthma, uncomplicated: Secondary | ICD-10-CM

## 2021-07-11 MED ORDER — BREZTRI AEROSPHERE 160-9-4.8 MCG/ACT IN AERO: 2.0000 | INHALATION_SPRAY | Freq: Two times a day (BID) | RESPIRATORY_TRACT | 3 refills | Status: DC

## 2021-07-11 NOTE — Patient Instructions (Addendum)
Severe Persistent Asthma- - your breathing test today showed moderate obstruction - Continue Breztri 1 puffs twice a day with a spacer; THIS SHOULD BE USED EVERY DAY  For Flares or Respiratory Illness: increase Breztri to 2 puffs twice a day and use for 2 weeks or until symptoms resolve, if no improvement after 2 to 3 days can add Pulmicort 0.5 mg via nebulizer for 1 to 2 weeks - Rinse mouth out after use - Use Albuterol (Proair/Ventolin) 2 puffs every 4-6 hours as needed for chest tightness, wheezing, or coughing and Albuterol (Proair/Ventolin) 2 puffs 15 minutes prior to exercise if you have symptoms with activity  - Asthma is not controlled if:  - Symptoms are occurring >2 times a week OR  - >2 times a month nighttime awakenings  - Please call the clinic to schedule a follow up if these symptoms arise   Chronic rhinitis - recent lab showed negative environmental panel - Continue Nasal Steroid Spray: Options include Flonase (fluticasone), Nasocort (triamcinolone), Nasonex (mometasome) 1- 2 sprays in each nostril daily (can buy over-the-counter if not covered by insurance)  - Continue Atrovent (Ipratropium Bromide) 1-2 sprays in each nostril up to 3 times a day as needed for runny nose/post nasal drip/drainage.   - Continue Allergy Eye drops - Continue Singulair (Montelukast) 10mg  daily  - Continue over the counter antihistamine daily or daily as needed.  Your options include Zyrtec (Cetirizine) 10mg , Claritin (Loratadine) 10mg , Allegra (Fexofenadine) 180mg , or Xyzal (Levocetirinze) 5mg  - Z-pack has been sent to your pharmacy   Tree Nut Allergy:  - labs negative for tree nuts and peanut - please strictly avoid tree nuts until ready to undergo oral challenge - for SKIN only reaction, okay to take Benadryl 2 capsules every 4 hours - for SKIN + ANY additional symptoms, OR IF concern for LIFE THREATENING reaction = Epipen Autoinjector AuviQ 0.3 mg. - If using Epinephrine autoinjector, call  911 - A food allergy action plan has been provided and discussed. - Medic Alert identification is recommended.  Follow-up in 6 months, sooner if needed.  It was a pleasure seeing you again in clinic today!  , MD [facility]

## 2021-12-27 ENCOUNTER — Other Ambulatory Visit: Payer: Self-pay | Admitting: Internal Medicine

## 2021-12-29 ENCOUNTER — Ambulatory Visit: Payer: Medicare Other | Admitting: Internal Medicine

## 2022-03-10 ENCOUNTER — Ambulatory Visit: Payer: Medicare Other | Admitting: Internal Medicine

## 2022-03-17 NOTE — Progress Notes (Unsigned)
FOLLOW UP Date of Service/Encounter:  03/20/22   Subjective:  Toni Hughes (DOB: [birth year]) is a 57 y.o. female PMHx of CAD, HTN, ischemic cardiomyopathy, sacroidosis, bronchiectasis, hx of hypersensitivity pneumonia, avascular necrosis of the hip who [facility]22 in re-evaluation of the following: Asthma, chronic rhinitis, adverse food reaction  History obtained from: chart review and patient.  For Review, LV was on 07/11/21  with Dr.Carollynn Pennywell seen for routine follow-up.  Today presents for follow-up. She does feel her breathing is doing better than it was because she started having anxiety attacks after finding out her husband was  diagnosed with recurrent cancer.  She has had one round of steroids, and antibiotics twice since her last visit. She was treated for sinus infections twice because there is dry heat in her home and that dries her out. She does have a small humidifier, but this does not help enough. Steroids were prescribed also for sinuses and not her breathing.  She did increase her Breztri during that time, and felt her asthma was controlled. She does not like the nasal sprays because she doesn't like the drainage going down her throat. Does use ipatropium but reports being overly dried. She was not aware this was a side effect. She has used albuterol 1-2 times in the past 6 months when weather changed from hot to cold.  She does not want to start a biologic. She feels her asthma is controlled.  Pertinent history/diagnostics: Chronic rhinitis:  -she has been on allergy shots at a separate clinic around 2016, completing only a few months, has severe needle phobia SPT 2018: environmental positive to ash, maple, alternaria, bipolaris, dreschlera, and (IDs):cockroach  -Blood testing 12/17/2020, negative environmental panel Moderate persistent asthma: -  Most recent CT: 05/14/18-IMPRESSION:  1. Stable findings consistent with chronic  sarcoidosis.  2. No active lung disease or other acute findings.   -Most recent Echo 11/03/20: -normal echocardiogram - Prepost spirometry 12/17/2020: Ratio 69%, FEV1 63% with 12% improvement in FEV1 on post - Poor technique noted when albuterol given in clinic -12/17/2020, IgE 38, AEC 100, Repeat AEC 12/21/2020 162, AEC 09/30/21: 100 Tree nut allergy: - Tree nut and peanut panel negative on blood testing 12/17/2020; she is NOT interested in an oral challenge  Allergies as of 03/20/2022       Reactions   Codeine Hives   Paranoid   Diphenhydramine Hcl Hives   Other Hives, Itching   Peanuts, tree nuts   Terbinafine Hives, Itching   Skin discoloration   Lamisil  [terbinafine] Hives   Penicillins Hives        Medication List        Accurate as of March 20, 2022  1:07 PM. If you have any questions, ask your nurse or doctor.          STOP taking these medications    azithromycin 250 MG tablet Commonly known as: Zithromax Z-Pak Stopped by: Verlee Monte, MD   ibuprofen 600 MG tablet Commonly known as: ADVIL Stopped by: Verlee Monte, MD   losartan-hydrochlorothiazide 100-12.5 MG tablet Commonly known as: HYZAAR Stopped by: Verlee Monte, MD   mupirocin ointment 2 % Commonly known as: BACTROBAN Stopped by: Verlee Monte, MD       TAKE these medications    albuterol 108 (90 Base) MCG/ACT inhaler Commonly known as: Ventolin HFA 2 puffs every 4 hours if needed for cough or wheeze.   amLODipine 10 MG tablet  Commonly known as: NORVASC Take by mouth.   aspirin 81 MG chewable tablet Chew by mouth.   Breztri Aerosphere 160-9-4.8 MCG/ACT Aero Generic drug: Budeson-Glycopyrrol-Formoterol Inhale 2 puffs into the lungs in the morning and at bedtime.   carvedilol 6.25 MG tablet Commonly known as: COREG Take 6.25 mg by mouth 2 (two) times daily.   cetirizine 10 MG tablet Commonly known as: ZYRTEC Take by mouth.   EPINEPHrine 0.3 mg/0.3 mL Soaj  injection Commonly known as: Auvi-Q Use as directed for severe allergic reaction.   Fluticasone Propionate 93 MCG/ACT Exhu Commonly known as: Xhance Place 2 sprays into the nose 2 (two) times daily.   hydrALAZINE 25 MG tablet Commonly known as: APRESOLINE Take by mouth.   ipratropium 0.03 % nasal spray Commonly known as: ATROVENT USE 2 SPRAYS IN EACH NOSTRIL THREE TIMES DAILY   isosorbide mononitrate 60 MG 24 hr tablet Commonly known as: IMDUR Take by mouth.   losartan 50 MG tablet Commonly known as: COZAAR Take by mouth.   meloxicam 7.5 MG tablet Commonly known as: MOBIC Take 7.5 mg by mouth daily.   metoprolol tartrate 25 MG tablet Commonly known as: LOPRESSOR Take by mouth.   montelukast 10 MG tablet Commonly known as: SINGULAIR Take 10 mg by mouth daily.   nitroGLYCERIN 0.4 mg/hr patch Commonly known as: NITRODUR - Dosed in mg/24 hr Place 0.4 mg onto the skin daily.   Oncovite Tabs Take by mouth.   pantoprazole 40 MG tablet Commonly known as: PROTONIX Take 40 mg by mouth daily.   ranolazine 500 MG 12 hr tablet Commonly known as: RANEXA Take by mouth.   rosuvastatin 40 MG tablet Commonly known as: CRESTOR Take by mouth.       Past Medical History:  Diagnosis Date   Allergy    Anxiety    Asthma    Heart disease    Hyperlipidemia    Hypertension    Recurrent upper respiratory infection (URI)    Sarcoidosis    Urticaria    Past Surgical History:  Procedure Laterality Date   BREAST LUMPECTOMY Right 10/2015   CARDIAC CATHETERIZATION  04/2015   Otherwise, there have been no changes to her past medical history, surgical history, family history, or social history.  ROS: All others negative except as noted per HPI.   Objective:  BP 126/78   Pulse (!) 54   Temp 98.1 F (36.7 C) (Temporal)   Resp 16   SpO2 98%  There is no height or weight on file to calculate BMI. Physical Exam: General Appearance:  Alert, cooperative, no distress,  appears stated age  Head:  Normocephalic, without obvious abnormality, atraumatic  Eyes:  Conjunctiva clear, EOM's intact  Nose: Nares normal, normal mucosa and no visible anterior polyps  Throat: Lips, tongue normal; teeth and gums normal, normal posterior oropharynx  Neck: Supple, symmetrical  Lungs:   clear to auscultation bilaterally, Respirations unlabored, no coughing  Heart:  regular rate and rhythm and no murmur, Appears well perfused  Extremities: No edema  Skin: Skin color, texture, turgor normal, no rashes or lesions on visualized portions of skin  Neurologic: No gross deficits    Spirometry:  Tracings reviewed. Her effort: Good reproducible efforts. FVC: 2.24L FEV1: 1.22L, 60% predicted FEV1/FVC ratio: 68% Interpretation: Spirometry consistent with moderate obstructive disease.  Please see scanned spirometry results for details.   Assessment/Plan  She reports controlled asthma though her spirometry continues To show moderate obstruction.  She is using her rescue inhaler  infrequently and has only required 1 round of steroids since last visit due to a sinus infection.  We have discussed increasing her breztri to 2 puffs twice daily should she feel her asthma is uncontrolled, and we have also discussed on multiple occasions starting an asthma biologic.  However she is needle phobic, and currently feels asymptomatic. We will continue current management for now.  Severe Persistent Asthma- - your breathing test today showed moderate obstruction - Continue Breztri 1 puffs twice a day with a spacer; THIS SHOULD BE USED EVERY DAY  For Flares or Respiratory Illness: increase Breztri to 2 puffs twice a day and use for 2 weeks or until symptoms resolve, if no improvement after 2 to 3 days can add Pulmicort 0.5 mg via nebulizer for 1 to 2 weeks - Rinse mouth out after use - Use Albuterol (Proair/Ventolin) 2 puffs every 4-6 hours as needed for chest tightness, wheezing, or coughing and  Albuterol (Proair/Ventolin) 2 puffs 15 minutes prior to exercise if you have symptoms with activity  - Asthma is not controlled if:  - Symptoms are occurring >2 times a week OR  - >2 times a month nighttime awakenings  - Please call the clinic to schedule a follow up if these symptoms arise   Chronic rhinitis - recent lab showed negative environmental panel - Start nasal saline gel to help lubricate - use nose to toes technique - Only if having drainage: Continue Atrovent (Ipratropium Bromide) 1-2 sprays in each nostril up to 3 times a day as needed for runny nose/post nasal drip/drainage.   - Continue Allergy Eye drops - Continue Singulair (Montelukast) 10mg  daily  - Continue Allegra (Fexofenadine) 180mg  daily  Tree Nut Allergy:  - labs negative for tree nuts and peanut - please strictly avoid tree nuts until ready to undergo oral challenge - for SKIN only reaction, okay to take Benadryl 2 capsules every 4 hours - for SKIN + ANY additional symptoms, OR IF concern for LIFE THREATENING reaction = Epipen Autoinjector AuviQ 0.3 mg. - If using Epinephrine autoinjector, call 911 - A food allergy action plan has been provided and discussed. - Medic Alert identification is recommended.  Follow-up in 6 months, sooner if needed.  It was a pleasure seeing you again in clinic today!  Sigurd Sos, MD [facility]  Sigurd Sos, MD  [facility] [place]

## 2022-03-20 ENCOUNTER — Encounter: Payer: Self-pay | Admitting: Internal Medicine

## 2022-03-20 ENCOUNTER — Ambulatory Visit (INDEPENDENT_AMBULATORY_CARE_PROVIDER_SITE_OTHER): Payer: 59 | Admitting: Internal Medicine

## 2022-03-20 VITALS — BP 126/78 | HR 54 | Temp 98.1°F | Resp 16

## 2022-03-20 DIAGNOSIS — R0602 Shortness of breath: Secondary | ICD-10-CM | POA: Diagnosis not present

## 2022-03-20 DIAGNOSIS — J310 Chronic rhinitis: Secondary | ICD-10-CM

## 2022-03-20 DIAGNOSIS — J4540 Moderate persistent asthma, uncomplicated: Secondary | ICD-10-CM | POA: Diagnosis not present

## 2022-03-20 DIAGNOSIS — T7800XD Anaphylactic reaction due to unspecified food, subsequent encounter: Secondary | ICD-10-CM

## 2022-03-20 MED ORDER — ALBUTEROL SULFATE HFA 108 (90 BASE) MCG/ACT IN AERS: INHALATION_SPRAY | RESPIRATORY_TRACT | 2 refills | Status: AC

## 2022-03-20 MED ORDER — MONTELUKAST SODIUM 10 MG PO TABS: 10.0000 mg | ORAL_TABLET | Freq: Every day | ORAL | 5 refills | Status: AC

## 2022-03-20 MED ORDER — EPINEPHRINE 0.3 MG/0.3ML IJ SOAJ: INTRAMUSCULAR | 2 refills | Status: AC

## 2022-03-20 MED ORDER — BREZTRI AEROSPHERE 160-9-4.8 MCG/ACT IN AERO: 2.0000 | INHALATION_SPRAY | Freq: Two times a day (BID) | RESPIRATORY_TRACT | 5 refills | Status: AC

## 2022-03-20 MED ORDER — IPRATROPIUM BROMIDE 0.03 % NA SOLN: NASAL | 4 refills | Status: AC

## 2022-03-20 NOTE — Patient Instructions (Signed)
Severe Persistent Asthma- - your breathing test today showed moderate obstruction - Continue Breztri 1 puffs twice a day with a spacer; THIS SHOULD BE USED EVERY DAY  For Flares or Respiratory Illness: increase Breztri to 2 puffs twice a day and use for 2 weeks or until symptoms resolve, if no improvement after 2 to 3 days can add Pulmicort 0.5 mg via nebulizer for 1 to 2 weeks - Rinse mouth out after use - Use Albuterol (Proair/Ventolin) 2 puffs every 4-6 hours as needed for chest tightness, wheezing, or coughing and Albuterol (Proair/Ventolin) 2 puffs 15 minutes prior to exercise if you have symptoms with activity  - Asthma is not controlled if:  - Symptoms are occurring >2 times a week OR  - >2 times a month nighttime awakenings  - Please call the clinic to schedule a follow up if these symptoms arise   Chronic rhinitis - recent lab showed negative environmental panel - Start nasal saline gel to help lubricate - use nose to toes technique - Only if having drainage: Continue Atrovent (Ipratropium Bromide) 1-2 sprays in each nostril up to 3 times a day as needed for runny nose/post nasal drip/drainage.   - Continue Allergy Eye drops - Continue Singulair (Montelukast) 10mg  daily  - Continue Allegra (Fexofenadine) 180mg  daily  Tree Nut Allergy:  - labs negative for tree nuts and peanut - please strictly avoid tree nuts until ready to undergo oral challenge - for SKIN only reaction, okay to take Benadryl 2 capsules every 4 hours - for SKIN + ANY additional symptoms, OR IF concern for LIFE THREATENING reaction = Epipen Autoinjector AuviQ 0.3 mg. - If using Epinephrine autoinjector, call 911 - A food allergy action plan has been provided and discussed. - Medic Alert identification is recommended.  Follow-up in 6 months, sooner if needed.  It was a pleasure seeing you again in clinic today!  Sigurd Sos, MD [facility]

## 2022-03-23 ENCOUNTER — Telehealth: Payer: Self-pay | Admitting: Internal Medicine

## 2022-03-23 DIAGNOSIS — T7800XD Anaphylactic reaction due to unspecified food, subsequent encounter: Secondary | ICD-10-CM

## 2022-03-23 MED ORDER — EPINEPHRINE 0.3 MG/0.3ML IJ SOAJ: 0.3000 mg | Freq: Once | INTRAMUSCULAR | Status: DC

## 2022-03-23 MED ORDER — EPINEPHRINE 0.3 MG/0.3ML IJ SOAJ: 0.3000 mg | INTRAMUSCULAR | 1 refills | Status: AC | PRN

## 2022-03-23 NOTE — Telephone Encounter (Signed)
epiPen 0.3mg  given at last visit med changed and sent to pharmacy.

## 2022-03-23 NOTE — Telephone Encounter (Signed)
Pt states insurance will not cover Auvi-Q.

## 2022-06-05 ENCOUNTER — Encounter: Payer: Self-pay | Admitting: *Deleted

## 2022-09-18 ENCOUNTER — Ambulatory Visit: Payer: 59 | Admitting: Internal Medicine

## 2022-09-19 NOTE — Progress Notes (Unsigned)
FOLLOW UP Date of Service/Encounter:  09/20/22  Subjective:  Toni Hughes (DOB: 1965/08/30) is a 57 y.o. female who [facility] in re-evaluation of the following: chronic rhinitis, asthma, tree nut allergy History obtained from: chart review and patient.  For Review, LV was on 03/20/22  with Dr.Charmian Forbis seen for routine follow-up. See below for summary of history and diagnostics.   Therapeutic plans/changes recommended: FEV1 60% predicted, moderate obstruction.  Continue Breztri 1 puff BID, increase during flares, atrovent nasal spray PRN, singulair daily, allegra daily, avoid tree nuts. Start nasal saline gel ----------------------------------------------------- Pertinent History/Diagnostics:  Chronic rhinitis:  -she has been on allergy shots at a separate clinic around 2016, completing only a few months, has severe needle phobia SPT 2018: environmental positive to ash, maple, alternaria, bipolaris, dreschlera, and (IDs):cockroach  -Blood testing 12/17/2020, negative environmental panel Moderate persistent asthma: -  Most recent CT: 05/14/18-IMPRESSION:  1. Stable findings consistent with chronic sarcoidosis.  2. No active lung disease or other acute findings.   -Most recent Echo 11/03/20: -normal echocardiogram - Prepost spirometry 12/17/2020: Ratio 69%, FEV1 63% with 12% improvement in FEV1 on post - Poor technique noted when albuterol given in clinic -12/17/2020, IgE 38, AEC 100, Repeat AEC 12/21/2020 162, AEC 09/30/21: 100 Tree nut allergy: - Tree nut and peanut panel negative on blood testing 12/17/2020; she is NOT interested in an oral challenge Other: PMHx of CAD, HTN, ischemic cardiomyopathy, sacroidosis, bronchiectasis, hx of hypersensitivity pneumonia, avascular necrosis of the hip   --------------------------------------------------- Today presents for follow-up. Husband recently passed away and she had not been eating or  sleeping well. She is grieving, but feels her asthma and allergies have been controlled. She is taking her breztri 1 puff twice a day. Used her albuterol a few weeks ago during a flare, but only once, and symptoms resolved. None since. She has not needed albuterol or systemic steroids since last visit. She does take her singulair daily and allegra, but has not needed nasal sprays since last visit. She has been mostly staying indoors taking care of her husband for the past several months. No accidental exposures to tree nuts.  All medications reviewed by clinical staff and updated in chart. No new pertinent medical or surgical history except as noted in HPI.  ROS: All others negative except as noted per HPI.   Objective:  BP 110/68 (BP Location: Left Arm, Patient Position: Sitting, Cuff Size: Normal)   Pulse 93   Temp 98 F (36.7 C) (Temporal)   Resp 20   Wt 154 lb 8 oz (70.1 kg)   SpO2 96%   BMI 28.26 kg/m  Body mass index is 28.26 kg/m. Physical Exam: General Appearance:  Alert, cooperative, no distress, appears stated age  Head:  Normocephalic, without obvious abnormality, atraumatic  Eyes:  Conjunctiva clear, EOM's intact  Ears EACs normal bilaterally and normal TMs bilaterally  Nose: Nares normal, hypertrophic turbinates, normal mucosa, and no visible anterior polyps  Throat: Lips, tongue normal; teeth and gums normal, normal posterior oropharynx  Neck: Supple, symmetrical  Lungs:   clear to auscultation bilaterally, Respirations unlabored, no coughing  Heart:  regular rate and rhythm and no murmur, Appears well perfused  Extremities: No edema  Skin: Skin color, texture, turgor normal and no rashes or lesions on visualized portions of skin  Neurologic: No gross deficits   Labs:  Lab Orders  No laboratory test(s) ordered today    Spirometry:  Tracings reviewed. Her effort:  Good reproducible efforts. FVC: 2.13L FEV1: 1.49L, 73% predicted FEV1/FVC ratio:  88% Interpretation: Spirometry consistent with normal pattern.  Please see scanned spirometry results for details.  Assessment/Plan   Severe Persistent Asthma-at goal Breathing test looked great today. - Continue Breztri 1 puffs twice a day with a spacer; THIS SHOULD BE USED EVERY DAY  For Flares or Respiratory Illness: increase Breztri to 2 puffs twice a day and use for 2 weeks or until symptoms resolve, if no improvement after 2 to 3 days can add Pulmicort 0.5 mg via nebulizer for 1 to 2 weeks - Rinse mouth out after use - Use Albuterol (Proair/Ventolin) 2 puffs every 4-6 hours as needed for chest tightness, wheezing, or coughing and Albuterol (Proair/Ventolin) 2 puffs 15 minutes prior to exercise if you have symptoms with activity  - Asthma is not controlled if:  - Symptoms are occurring >2 times a week OR  - >2 times a month nighttime awakenings  - Please call the clinic to schedule a follow up if these symptoms arise  Chronic rhinitis-at goal - 2022 negative environmental panel - Nasal saline gel to help lubricate - use nose to toes technique - Only if having drainage: Continue Atrovent (Ipratropium Bromide) 1-2 sprays in each nostril up to 3 times a day as needed for runny nose/post nasal drip/drainage.   - Continue Allergy Eye drops - Continue Singulair (Montelukast) 10mg  daily  - Continue Allegra (Fexofenadine) 180mg  daily  Tree Nut Allergy: stable - labs negative for tree nuts and peanut - please strictly avoid tree nuts until ready to undergo oral challenge - for SKIN only reaction, okay to take Benadryl 2 capsules every 4 hours - for SKIN + ANY additional symptoms, OR IF concern for LIFE THREATENING reaction = Epipen Autoinjector AuviQ 0.3 mg. - If using Epinephrine autoinjector, call 911 - A food allergy action plan has been provided and discussed. - Medic Alert identification is recommended.  Follow-up in 6 months, sooner if needed.  It was a pleasure seeing you  again in clinic today!  Tonny Bollman, MD  [facility]

## 2022-09-20 ENCOUNTER — Other Ambulatory Visit: Payer: Self-pay

## 2022-09-20 ENCOUNTER — Encounter: Payer: Self-pay | Admitting: Internal Medicine

## 2022-09-20 ENCOUNTER — Ambulatory Visit: Payer: Medicare Other | Admitting: Internal Medicine

## 2022-09-20 VITALS — BP 110/68 | HR 93 | Temp 98.0°F | Resp 20 | Wt 154.5 lb

## 2022-09-20 DIAGNOSIS — T781XXD Other adverse food reactions, not elsewhere classified, subsequent encounter: Secondary | ICD-10-CM

## 2022-09-20 DIAGNOSIS — J4540 Moderate persistent asthma, uncomplicated: Secondary | ICD-10-CM

## 2022-09-20 DIAGNOSIS — J310 Chronic rhinitis: Secondary | ICD-10-CM | POA: Diagnosis not present

## 2022-09-20 NOTE — Patient Instructions (Addendum)
Severe Persistent Asthma- - Continue Breztri 1 puffs twice a day with a spacer; THIS SHOULD BE USED EVERY DAY  For Flares or Respiratory Illness: increase Breztri to 2 puffs twice a day and use for 2 weeks or until symptoms resolve, if no improvement after 2 to 3 days can add Pulmicort 0.5 mg via nebulizer for 1 to 2 weeks - Rinse mouth out after use - Use Albuterol (Proair/Ventolin) 2 puffs every 4-6 hours as needed for chest tightness, wheezing, or coughing and Albuterol (Proair/Ventolin) 2 puffs 15 minutes prior to exercise if you have symptoms with activity  - Asthma is not controlled if:  - Symptoms are occurring >2 times a week OR  - >2 times a month nighttime awakenings  - Please call the clinic to schedule a follow up if these symptoms arise  Chronic rhinitis - 2022 negative environmental panel - Nasal saline gel to help lubricate - use nose to toes technique - Only if having drainage: Continue Atrovent (Ipratropium Bromide) 1-2 sprays in each nostril up to 3 times a day as needed for runny nose/post nasal drip/drainage.   - Continue Allergy Eye drops - Continue Singulair (Montelukast) 10mg  daily  - Continue Allegra (Fexofenadine) 180mg  daily  Tree Nut Allergy:  - labs negative for tree nuts and peanut - please strictly avoid tree nuts until ready to undergo oral challenge - for SKIN only reaction, okay to take Benadryl 2 capsules every 4 hours - for SKIN + ANY additional symptoms, OR IF concern for LIFE THREATENING reaction = Epipen Autoinjector AuviQ 0.3 mg. - If using Epinephrine autoinjector, call 911 - A food allergy action plan has been provided and discussed. - Medic Alert identification is recommended.  Follow-up in 6 months, sooner if needed.  It was a pleasure seeing you again in clinic today! I am so sorry for your loss.  Tonny Bollman, MD [facility]

## 2023-03-23 ENCOUNTER — Ambulatory Visit: Payer: Medicare Other | Admitting: Internal Medicine

## 2023-06-23 ENCOUNTER — Observation Stay (HOSPITAL_BASED_OUTPATIENT_CLINIC_OR_DEPARTMENT_OTHER)
Admission: EM | Admit: 2023-06-23 | Discharge: 2023-06-24 | Disposition: A | Discharge status: Home / Self Care | Attending: Family Medicine | Admitting: Family Medicine

## 2023-06-23 ENCOUNTER — Other Ambulatory Visit: Payer: Self-pay

## 2023-06-23 ENCOUNTER — Encounter (HOSPITAL_BASED_OUTPATIENT_CLINIC_OR_DEPARTMENT_OTHER): Payer: Self-pay | Admitting: Emergency Medicine

## 2023-06-23 ENCOUNTER — Emergency Department (HOSPITAL_BASED_OUTPATIENT_CLINIC_OR_DEPARTMENT_OTHER)

## 2023-06-23 DIAGNOSIS — E872 Acidosis, unspecified: Secondary | ICD-10-CM | POA: Insufficient documentation

## 2023-06-23 DIAGNOSIS — N200 Calculus of kidney: Principal | ICD-10-CM

## 2023-06-23 DIAGNOSIS — N202 Calculus of kidney with calculus of ureter: Secondary | ICD-10-CM | POA: Diagnosis not present

## 2023-06-23 DIAGNOSIS — Z79899 Other long term (current) drug therapy: Secondary | ICD-10-CM | POA: Diagnosis not present

## 2023-06-23 DIAGNOSIS — E860 Dehydration: Secondary | ICD-10-CM | POA: Diagnosis present

## 2023-06-23 DIAGNOSIS — N139 Obstructive and reflux uropathy, unspecified: Secondary | ICD-10-CM | POA: Insufficient documentation

## 2023-06-23 DIAGNOSIS — I119 Hypertensive heart disease without heart failure: Secondary | ICD-10-CM | POA: Insufficient documentation

## 2023-06-23 DIAGNOSIS — I10 Essential (primary) hypertension: Secondary | ICD-10-CM | POA: Diagnosis present

## 2023-06-23 DIAGNOSIS — Z7982 Long term (current) use of aspirin: Secondary | ICD-10-CM | POA: Insufficient documentation

## 2023-06-23 DIAGNOSIS — E876 Hypokalemia: Secondary | ICD-10-CM | POA: Insufficient documentation

## 2023-06-23 DIAGNOSIS — E8729 Other acidosis: Secondary | ICD-10-CM | POA: Diagnosis present

## 2023-06-23 DIAGNOSIS — R392 Extrarenal uremia: Secondary | ICD-10-CM | POA: Diagnosis not present

## 2023-06-23 DIAGNOSIS — N390 Urinary tract infection, site not specified: Secondary | ICD-10-CM | POA: Diagnosis not present

## 2023-06-23 DIAGNOSIS — J4540 Moderate persistent asthma, uncomplicated: Secondary | ICD-10-CM | POA: Diagnosis not present

## 2023-06-23 DIAGNOSIS — N19 Unspecified kidney failure: Secondary | ICD-10-CM | POA: Diagnosis present

## 2023-06-23 DIAGNOSIS — K219 Gastro-esophageal reflux disease without esophagitis: Secondary | ICD-10-CM | POA: Insufficient documentation

## 2023-06-23 DIAGNOSIS — E785 Hyperlipidemia, unspecified: Secondary | ICD-10-CM | POA: Diagnosis not present

## 2023-06-23 DIAGNOSIS — R109 Unspecified abdominal pain: Secondary | ICD-10-CM | POA: Diagnosis present

## 2023-06-23 DIAGNOSIS — Z96642 Presence of left artificial hip joint: Secondary | ICD-10-CM | POA: Diagnosis not present

## 2023-06-23 DIAGNOSIS — N3001 Acute cystitis with hematuria: Secondary | ICD-10-CM | POA: Diagnosis not present

## 2023-06-23 DIAGNOSIS — N3000 Acute cystitis without hematuria: Secondary | ICD-10-CM | POA: Diagnosis present

## 2023-06-23 LAB — CBC WITH DIFFERENTIAL/PLATELET
Abs Immature Granulocytes: 0.03 10*3/uL (ref 0.00–0.07)
Basophils Absolute: 0 10*3/uL (ref 0.0–0.1)
Basophils Relative: 0 %
Eosinophils Absolute: 0.1 10*3/uL (ref 0.0–0.5)
Eosinophils Relative: 1 %
HCT: 40.6 % (ref 36.0–46.0)
Hemoglobin: 13.5 g/dL (ref 12.0–15.0)
Immature Granulocytes: 0 %
Lymphocytes Relative: 9 %
Lymphs Abs: 1.2 10*3/uL (ref 0.7–4.0)
MCH: 30.6 pg (ref 26.0–34.0)
MCHC: 33.3 g/dL (ref 30.0–36.0)
MCV: 92.1 fL (ref 80.0–100.0)
Monocytes Absolute: 1 10*3/uL (ref 0.1–1.0)
Monocytes Relative: 8 %
Neutro Abs: 10.1 10*3/uL — ABNORMAL HIGH (ref 1.7–7.7)
Neutrophils Relative %: 82 %
Platelets: 304 10*3/uL (ref 150–400)
RBC: 4.41 MIL/uL (ref 3.87–5.11)
RDW: 14.1 % (ref 11.5–15.5)
WBC: 12.4 10*3/uL — ABNORMAL HIGH (ref 4.0–10.5)
nRBC: 0 % (ref 0.0–0.2)

## 2023-06-23 LAB — URINALYSIS, ROUTINE W REFLEX MICROSCOPIC
Bilirubin Urine: NEGATIVE
Glucose, UA: NEGATIVE mg/dL
Ketones, ur: NEGATIVE mg/dL
Nitrite: POSITIVE — AB
Protein, ur: NEGATIVE mg/dL
Specific Gravity, Urine: 1.02 (ref 1.005–1.030)
pH: 6.5 (ref 5.0–8.0)

## 2023-06-23 LAB — COMPREHENSIVE METABOLIC PANEL WITH GFR
ALT: 7 U/L (ref 0–44)
AST: 15 U/L (ref 15–41)
Albumin: 4.3 g/dL (ref 3.5–5.0)
Alkaline Phosphatase: 96 U/L (ref 38–126)
Anion gap: 18 — ABNORMAL HIGH (ref 5–15)
BUN: 19 mg/dL (ref 6–20)
CO2: 21 mmol/L — ABNORMAL LOW (ref 22–32)
Calcium: 10.3 mg/dL (ref 8.9–10.3)
Chloride: 103 mmol/L (ref 98–111)
Creatinine, Ser: 0.89 mg/dL (ref 0.44–1.00)
GFR, Estimated: >60 mL/min (ref >60)
Glucose, Bld: 115 mg/dL — ABNORMAL HIGH (ref 70–99)
Potassium: 3.2 mmol/L — ABNORMAL LOW (ref 3.5–5.1)
Sodium: 142 mmol/L (ref 135–145)
Total Bilirubin: 0.5 mg/dL (ref 0.0–1.2)
Total Protein: 7.6 g/dL (ref 6.5–8.1)

## 2023-06-23 LAB — URINALYSIS, MICROSCOPIC (REFLEX)

## 2023-06-23 LAB — LACTIC ACID, PLASMA: Lactic Acid, Venous: 0.4 mmol/L — ABNORMAL LOW (ref 0.5–1.9)

## 2023-06-23 LAB — LIPASE, BLOOD: Lipase: 24 U/L (ref 11–51)

## 2023-06-23 MED ORDER — KETOROLAC TROMETHAMINE 15 MG/ML IJ SOLN
15.0000 mg | Freq: Once | INTRAMUSCULAR | Status: AC
Start: 2023-06-23 — End: 2023-06-23
  Administered 2023-06-23: 15 mg via INTRAVENOUS
  Filled 2023-06-23: qty 1

## 2023-06-23 MED ORDER — LEVOFLOXACIN IN D5W 500 MG/100ML IV SOLN
500.0000 mg | Freq: Once | INTRAVENOUS | Status: AC
  Administered 2023-06-23: 500 mg via INTRAVENOUS
  Filled 2023-06-23: qty 100

## 2023-06-23 MED ORDER — TAMSULOSIN HCL 0.4 MG PO CAPS
0.4000 mg | ORAL_CAPSULE | Freq: Every day | ORAL | Status: DC
  Administered 2023-06-23 – 2023-06-24 (×2): 0.4 mg via ORAL
  Filled 2023-06-23 (×2): qty 1

## 2023-06-23 MED ORDER — SODIUM CHLORIDE 0.9 % IV SOLN
1.0000 g | Freq: Once | INTRAVENOUS | Status: DC
  Filled 2023-06-23: qty 10

## 2023-06-23 NOTE — ED Triage Notes (Signed)
 Pt reports LT flank pain x 5d; +NV

## 2023-06-23 NOTE — ED Notes (Signed)
 [facility] called for transport.

## 2023-06-23 NOTE — ED Provider Notes (Signed)
 [place] [facility] Provider Note   CSN: [id number] Arrival date & time: 06/23/23  1909     History  Chief Complaint  Patient presents with   Flank Pain    Toni Hughes is a 58 y.o. female with past medical history of HTN, HLD, asthma, sarcoidosis presents to ED for evaluation of left flank pain for the past week.  Pain has been described as intermittent and sharp.  She also endorses some associated increased frequency and dysuria.  Sought ED evaluation today as she had 2 episodes of vomiting.  Was evaluated by [facility] on Friday who placed her on doxycycline  BID for 7 days for UTI.  She is on day 2 of 7 of that treatment.   Flank Pain Pertinent negatives include no chest pain, no abdominal pain, no headaches and no shortness of breath.     Home Medications Prior to Admission medications   Medication Sig Start Date End Date Taking? Authorizing Provider  albuterol  (VENTOLIN  HFA) 108 (90 Base) MCG/ACT inhaler 2 puffs every 4 hours if needed for cough or wheeze. 03/20/22   Sean Czar, MD  amLODipine (NORVASC) 10 MG tablet Take by mouth.    [provider]  aspirin  81 MG chewable tablet Chew by mouth.    [provider]  Budeson-Glycopyrrol-Formoterol  (BREZTRI  AEROSPHERE) 160-9-4.8 MCG/ACT AERO Inhale 2 puffs into the lungs in the morning and at bedtime. 03/20/22   Sean Czar, MD  carvedilol (COREG) 6.25 MG tablet Take 6.25 mg by mouth 2 (two) times daily. 11/11/20   [provider]  cetirizine (ZYRTEC) 10 MG tablet Take by mouth.    [provider]  EPINEPHrine  (AUVI-Q ) 0.3 mg/0.3 mL IJ SOAJ injection Use as directed for severe allergic reaction. 03/20/22   Sean Czar, MD  EPINEPHrine  (EPIPEN  2-PAK) 0.3 mg/0.3 mL IJ SOAJ injection Inject 0.3 mg into the muscle as needed for anaphylaxis. 03/23/22   Sean Czar, MD  hydrALAZINE  (APRESOLINE ) 25 MG tablet Take by mouth. 02/27/18   [provider]  ipratropium (ATROVENT ) 0.03 % nasal spray USE 2 SPRAYS IN EACH NOSTRIL THREE TIMES DAILY as needed. Use less frequently if nose becomes too dry. 03/20/22   Sean Czar, MD  isosorbide mononitrate (IMDUR) 60 MG 24 hr tablet Take by mouth. 11/26/15 11/25/16  [provider]  losartan (COZAAR) 50 MG tablet Take by mouth. 12/06/21   [provider]  meloxicam (MOBIC) 7.5 MG tablet Take 7.5 mg by mouth daily. 11/22/20   [provider]  metoprolol tartrate (LOPRESSOR) 25 MG tablet Take by mouth. 11/26/15 11/25/16  [provider]  montelukast  (SINGULAIR ) 10 MG tablet Take 1 tablet (10 mg total) by mouth daily. 03/20/22   Sean Czar, MD  Multiple Vitamins-Minerals (ONCOVITE) TABS Take by mouth.    [provider]  nitroGLYCERIN (NITRODUR - DOSED IN MG/24 HR) 0.4 mg/hr patch Place 0.4 mg onto the skin daily.    [provider]  pantoprazole  (PROTONIX ) 40 MG tablet Take 40 mg by mouth daily. 10/18/20   [provider]  ranolazine  (RANEXA ) 500 MG 12 hr tablet Take by mouth. 02/27/18   [provider]  rosuvastatin  (CRESTOR ) 40 MG tablet Take by mouth. 11/26/15 11/25/16  [provider]      Allergies    Codeine, Diphenhydramine hcl, Other, Terbinafine, Lamisil  [terbinafine], and Penicillins    Review of Systems   Review of Systems  Constitutional:  Negative  for chills, fatigue and fever.  Respiratory:  Negative for cough, chest tightness, shortness of breath and wheezing.   Cardiovascular:  Negative for chest pain and palpitations.  Gastrointestinal:  Positive for vomiting. Negative for abdominal pain, constipation, diarrhea and nausea.  Genitourinary:  Positive for flank pain.  Neurological:  Negative for dizziness, seizures, weakness, light-headedness, numbness and headaches.    Physical Exam Updated Vital Signs BP (!) 170/95 (BP Location: Right Arm)   Pulse (!) 56   Temp 98.6 F (37 C) (Oral)   Resp 14   Ht  5' 1.5" (1.562 m)   Wt 66.7 kg   SpO2 97%   BMI 27.33 kg/m  Physical Exam Vitals and nursing note reviewed.  Constitutional:      General: She is not in acute distress.    Appearance: Normal appearance.  HENT:     Head: Normocephalic and atraumatic.  Eyes:     Conjunctiva/sclera: Conjunctivae normal.  Cardiovascular:     Rate and Rhythm: Normal rate.  Pulmonary:     Effort: Pulmonary effort is normal. No respiratory distress.     Breath sounds: Normal breath sounds.  Chest:     Chest wall: No tenderness.  Abdominal:     General: Bowel sounds are normal. There is no distension.     Palpations: Abdomen is soft.     Tenderness: There is no abdominal tenderness. There is left CVA tenderness. There is no guarding or rebound.  Musculoskeletal:     Right lower leg: No edema.     Left lower leg: No edema.  Skin:    Coloration: Skin is not jaundiced or pale.  Neurological:     Mental Status: She is alert and oriented to person, place, and time. Mental status is at baseline.     ED Results / Procedures / Treatments   Labs (all labs ordered are listed, but only abnormal results are displayed) Labs Reviewed  COMPREHENSIVE METABOLIC PANEL WITH GFR - Abnormal; Notable for the following components:      Result Value   Potassium 3.2 (*)    CO2 21 (*)    Glucose, Bld 115 (*)    Anion gap 18 (*)    All other components within normal limits  CBC WITH DIFFERENTIAL/PLATELET - Abnormal; Notable for the following components:   WBC 12.4 (*)    Neutro Abs 10.1 (*)    All other components within normal limits  URINALYSIS, ROUTINE W REFLEX MICROSCOPIC - Abnormal; Notable for the following components:   APPearance HAZY (*)    Hgb urine dipstick LARGE (*)    Nitrite POSITIVE (*)    Leukocytes,Ua SMALL (*)    All other components within normal limits  URINALYSIS, MICROSCOPIC (REFLEX) - Abnormal; Notable for the following components:   Bacteria, UA FEW (*)    All other components within  normal limits  LACTIC ACID, PLASMA - Abnormal; Notable for the following components:   Lactic Acid, Venous 0.4 (*)    All other components within normal limits  URINE CULTURE  LIPASE, BLOOD  LACTIC ACID, PLASMA    EKG None  Radiology CT Renal Stone Study Result Date: 06/23/2023 CLINICAL DATA:  Left flank pain, stone suspected EXAM: CT ABDOMEN AND PELVIS WITHOUT CONTRAST TECHNIQUE: Multidetector CT imaging of the abdomen and pelvis was performed following the standard protocol without IV contrast. RADIATION DOSE REDUCTION: This exam was performed according to the departmental dose-optimization program which includes automated exposure control, adjustment of the mA and/or kV according  to patient size and/or use of iterative reconstruction technique. COMPARISON:  None Available. FINDINGS: Lower chest: No acute abnormality. Hepatobiliary: Unremarkable liver. Normal gallbladder. No biliary dilation. Pancreas: Unremarkable. Spleen: Unremarkable. Adrenals/Urinary Tract: Normal adrenal glands. Mild left hydroureteronephrosis. 6-7 mm calcification near the expected location of the left UVJ (series 2/image 73) is suspected to represent an obstructing stone. Evaluation is limited by streak artifact from the left THA. Additional punctate nonobstructing left nephrolithiasis. No urinary calculi or hydronephrosis on the right. Bladder is unremarkable. Stomach/Bowel: Normal caliber large and small bowel. Extensive colonic diverticulosis without diverticulitis no bowel wall thickening. The appendix is normal. Small hiatal hernia. Vascular/Lymphatic: Aortic atherosclerosis. No enlarged abdominal or pelvic lymph nodes. Reproductive: Unremarkable. Other: No free intraperitoneal fluid or air. Musculoskeletal: No acute fracture.  Left THA. IMPRESSION: 1. Mild left hydroureteronephrosis upstream from a 6-7 mm stone at the left UVJ. 2. Additional punctate nonobstructing left nephrolithiasis. 3. Extensive colonic diverticulosis  without diverticulitis. 4. Aortic Atherosclerosis (ICD10-I70.0). Electronically Signed   By: Rozell Cornet M.D.   On: 06/23/2023 21:33    Procedures Procedures    Medications Ordered in ED Medications  levofloxacin  (LEVAQUIN ) IVPB 500 mg (has no administration in time range)  tamsulosin  (FLOMAX ) capsule 0.4 mg (has no administration in time range)  ketorolac  (TORADOL ) 15 MG/ML injection 15 mg (15 mg Intravenous Given 06/23/23 2102)    ED Course/ Medical Decision Making/ A&P                                 Medical Decision Making Amount and/or Complexity of Data Reviewed Labs: ordered. Radiology: ordered.  Risk Prescription drug management. Decision regarding hospitalization.   Patient presents to the ED for concern of frequency, dysuria, vomiting, this involves an extensive number of treatment options, and is a complaint that carries with it a high risk of complications and morbidity.  The differential diagnosis includes UTI, kidney stone, infected stone, pyelonephritis, intra-abdominal pathology   Co morbidities that complicate the patient evaluation  Recently on day 2 of 7 of Doxy for UTI   Additional history obtained:  Additional history obtained from Nursing   External records from outside source obtained and reviewed including triage RN note Attempted to find [facility] medical note, UA results however did not see any on chart review   Lab Tests:  I Ordered, and personally interpreted labs.  The pertinent results include:   Potassium 3.2 CBG 115 Anion gap 18 WBC 12.4 Lactic 0.4 UA shows large Hgb, positive nitrates, small leukocytes, RBC, WBC, few bacteria   Imaging Studies ordered:  I ordered imaging studies including CT renal study I independently visualized and interpreted imaging which showed  Mild left hydroureteronephrosis upstream from a 6-7 mm stone at the left UVJ Additional punctuate nonobstructing left nephrolithiasis Colonic diverticulosis I  agree with the radiologist interpretation     Medicines ordered and prescription drug management:  I ordered medication including Toradol , Levaquin  for pain and UTI Reevaluation of the patient after these medicines showed that the patient improved I have reviewed the patients home medicines and have made adjustments as needed     Consultations Obtained:  I requested consultation with Urology [facility] resident and Dr. Ranae Burrow MD,  and discussed lab and imaging findings as well as pertinent plan - they recommend:  N.p.o. at midnight for suspected stent placement tomorrow Levaquin  500 every 24 hours for infected stone Admission by medicine to [facility] I requested consultation  with hospitalist Dr. Reesa Cannon, and discussed lab and imaging findings as well as pertinent plan-they accept patient for admission to [facility] List / ED Course:  Infected stone WBC 12.4 Lactic neg No fever nor tachycardia while in ED. Hemodynamically stable  Patient has doxy bottle for 100mg  BID for 7 days but no records found on chart review of [facility] UA suggested UTI. Culture pending Provided Levaquin  for infected stone as patient is allergic to PCN Will admit to [facility] per uro   Reevaluation:  After the interventions noted above, I reevaluated the patient and found that they have :improved     Dispostion:  After consideration of the diagnostic results and the patients response to treatment, I feel that the patent would benefit from admission  Discussed ED workup, plan with patient who expresses understanding and agrees with plan  Final Clinical Impression(s) / ED Diagnoses Final diagnoses:  Kidney stone  Urinary tract infection with hematuria, site unspecified    Rx / DC Orders ED Discharge Orders     None         Royann Cords, PA 06/23/23 2240    Quinn Bucco, DO 07/07/23 0720

## 2023-06-23 NOTE — ED Notes (Signed)
 Patient transported to CT

## 2023-06-23 NOTE — Treatment Plan (Addendum)
 Briefly, this is a 57yF with a history of HTN, asthma, HLD, sarcoidosis who presented to the ED with left flank pain x 1 week with associated nausea and vomiting. She was diagnosed with a UTI at [facility] 2 days ago and has been on doxycycline (unable to view test results). Denies fevers/chills.   In the ED she is afebrile and HDS. Has a leukocytosis to 12.4, no AKI. UA is nitrite + with 10-20 WBC.   CT shows 7mm left UVJ stone.  Takes ASA 81mg  and ticagrelor 60 BID for hx of CAD (Follows with Dr. Mardelle Shady, [facility], notes in care everywhere last seen 06/12/23).   Recommendations: - Please transfer to hospitalist service at [facility]. Urology to evaluate in the morning and will plan for cysto with left stent placement, possible ureteroscopy on 06/24/23 - Patient has penicillin allergy and declines cephalosporins. Recommend levaquin for abx. - Please keep NPO at midnight - Flomax - strain all urine - If patient becomes febrile, please place foley catheter and page urology for consideration of more urgent intervention  Discussed with Dr. Inga Manges. Formal consult to follow in the morning.   Alphonza Ashing, MD PGY4, Urology

## 2023-06-24 ENCOUNTER — Inpatient Hospital Stay (HOSPITAL_BASED_OUTPATIENT_CLINIC_OR_DEPARTMENT_OTHER): Admitting: Anesthesiology

## 2023-06-24 ENCOUNTER — Inpatient Hospital Stay (HOSPITAL_COMMUNITY)

## 2023-06-24 ENCOUNTER — Encounter (HOSPITAL_COMMUNITY): Payer: Self-pay | Admitting: Internal Medicine

## 2023-06-24 ENCOUNTER — Inpatient Hospital Stay (HOSPITAL_COMMUNITY): Admitting: Anesthesiology

## 2023-06-24 ENCOUNTER — Encounter (HOSPITAL_COMMUNITY)
Admission: EM | Disposition: A | Discharge status: Home / Self Care | Payer: Self-pay | Source: Home / Self Care | Attending: Family Medicine

## 2023-06-24 DIAGNOSIS — E876 Hypokalemia: Secondary | ICD-10-CM | POA: Diagnosis not present

## 2023-06-24 DIAGNOSIS — K219 Gastro-esophageal reflux disease without esophagitis: Secondary | ICD-10-CM

## 2023-06-24 DIAGNOSIS — N200 Calculus of kidney: Secondary | ICD-10-CM

## 2023-06-24 DIAGNOSIS — J4540 Moderate persistent asthma, uncomplicated: Secondary | ICD-10-CM | POA: Diagnosis not present

## 2023-06-24 DIAGNOSIS — R109 Unspecified abdominal pain: Secondary | ICD-10-CM | POA: Diagnosis not present

## 2023-06-24 DIAGNOSIS — J45909 Unspecified asthma, uncomplicated: Secondary | ICD-10-CM

## 2023-06-24 DIAGNOSIS — I10 Essential (primary) hypertension: Secondary | ICD-10-CM

## 2023-06-24 DIAGNOSIS — N202 Calculus of kidney with calculus of ureter: Secondary | ICD-10-CM | POA: Diagnosis not present

## 2023-06-24 DIAGNOSIS — N3001 Acute cystitis with hematuria: Secondary | ICD-10-CM | POA: Diagnosis not present

## 2023-06-24 DIAGNOSIS — N201 Calculus of ureter: Secondary | ICD-10-CM

## 2023-06-24 DIAGNOSIS — N19 Unspecified kidney failure: Secondary | ICD-10-CM | POA: Diagnosis present

## 2023-06-24 DIAGNOSIS — I2510 Atherosclerotic heart disease of native coronary artery without angina pectoris: Secondary | ICD-10-CM

## 2023-06-24 DIAGNOSIS — E782 Mixed hyperlipidemia: Secondary | ICD-10-CM

## 2023-06-24 DIAGNOSIS — N139 Obstructive and reflux uropathy, unspecified: Secondary | ICD-10-CM | POA: Diagnosis present

## 2023-06-24 DIAGNOSIS — N3000 Acute cystitis without hematuria: Secondary | ICD-10-CM | POA: Diagnosis present

## 2023-06-24 DIAGNOSIS — E8729 Other acidosis: Secondary | ICD-10-CM | POA: Diagnosis present

## 2023-06-24 DIAGNOSIS — E785 Hyperlipidemia, unspecified: Secondary | ICD-10-CM | POA: Diagnosis present

## 2023-06-24 DIAGNOSIS — E860 Dehydration: Secondary | ICD-10-CM | POA: Diagnosis present

## 2023-06-24 HISTORY — PX: CYSTOSCOPY/URETEROSCOPY/HOLMIUM LASER/STENT PLACEMENT: SHX6546

## 2023-06-24 LAB — COMPREHENSIVE METABOLIC PANEL WITH GFR
ALT: 11 U/L (ref 0–44)
AST: 13 U/L — ABNORMAL LOW (ref 15–41)
Albumin: 3.7 g/dL (ref 3.5–5.0)
Alkaline Phosphatase: 68 U/L (ref 38–126)
Anion gap: 14 (ref 5–15)
BUN: 16 mg/dL (ref 6–20)
CO2: 23 mmol/L (ref 22–32)
Calcium: 9 mg/dL (ref 8.9–10.3)
Chloride: 104 mmol/L (ref 98–111)
Creatinine, Ser: 0.62 mg/dL (ref 0.44–1.00)
GFR, Estimated: >60 mL/min (ref >60)
Glucose, Bld: 114 mg/dL — ABNORMAL HIGH (ref 70–99)
Potassium: 3.1 mmol/L — ABNORMAL LOW (ref 3.5–5.1)
Sodium: 141 mmol/L (ref 135–145)
Total Bilirubin: 1.1 mg/dL (ref 0.0–1.2)
Total Protein: 6.8 g/dL (ref 6.5–8.1)

## 2023-06-24 LAB — CBC WITH DIFFERENTIAL/PLATELET
Abs Immature Granulocytes: 0.02 10*3/uL (ref 0.00–0.07)
Basophils Absolute: 0 10*3/uL (ref 0.0–0.1)
Basophils Relative: 0 %
Eosinophils Absolute: 0.1 10*3/uL (ref 0.0–0.5)
Eosinophils Relative: 1 %
HCT: 39.5 % (ref 36.0–46.0)
Hemoglobin: 12.7 g/dL (ref 12.0–15.0)
Immature Granulocytes: 0 %
Lymphocytes Relative: 25 %
Lymphs Abs: 2 10*3/uL (ref 0.7–4.0)
MCH: 30.8 pg (ref 26.0–34.0)
MCHC: 32.2 g/dL (ref 30.0–36.0)
MCV: 95.6 fL (ref 80.0–100.0)
Monocytes Absolute: 0.8 10*3/uL (ref 0.1–1.0)
Monocytes Relative: 10 %
Neutro Abs: 4.9 10*3/uL (ref 1.7–7.7)
Neutrophils Relative %: 64 %
Platelets: 252 10*3/uL (ref 150–400)
RBC: 4.13 MIL/uL (ref 3.87–5.11)
RDW: 14.1 % (ref 11.5–15.5)
WBC: 7.8 10*3/uL (ref 4.0–10.5)
nRBC: 0 % (ref 0.0–0.2)

## 2023-06-24 LAB — ABO/RH: ABO/RH(D): A POS

## 2023-06-24 LAB — PROTIME-INR
INR: 1 (ref 0.8–1.2)
Prothrombin Time: 13.6 s (ref 11.4–15.2)

## 2023-06-24 LAB — MAGNESIUM: Magnesium: 1.9 mg/dL (ref 1.7–2.4)

## 2023-06-24 LAB — URINE CULTURE: Culture: <10000 — AB

## 2023-06-24 LAB — TYPE AND SCREEN
ABO/RH(D): A POS
Antibody Screen: NEGATIVE

## 2023-06-24 SURGERY — CYSTOSCOPY/URETEROSCOPY/HOLMIUM LASER/STENT PLACEMENT
Anesthesia: General | Site: Ureter | Laterality: Left

## 2023-06-24 MED ORDER — SODIUM CHLORIDE 0.9 % IV SOLN: INTRAVENOUS | Status: AC

## 2023-06-24 MED ORDER — HYDRALAZINE HCL 25 MG PO TABS: 25.0000 mg | ORAL_TABLET | Freq: Two times a day (BID) | ORAL | Status: AC

## 2023-06-24 MED ORDER — MIDAZOLAM HCL 2 MG/2ML IJ SOLN
INTRAMUSCULAR | Status: DC | PRN
  Administered 2023-06-24: 2 mg via INTRAVENOUS

## 2023-06-24 MED ORDER — AMISULPRIDE (ANTIEMETIC) 5 MG/2ML IV SOLN: 10.0000 mg | Freq: Once | INTRAVENOUS | Status: DC | PRN

## 2023-06-24 MED ORDER — IRBESARTAN 150 MG PO TABS: 150.0000 mg | ORAL_TABLET | Freq: Every day | ORAL | Status: AC

## 2023-06-24 MED ORDER — IOHEXOL 300 MG/ML  SOLN
INTRAMUSCULAR | Status: DC | PRN
  Administered 2023-06-24: 3 mL

## 2023-06-24 MED ORDER — SULFAMETHOXAZOLE-TRIMETHOPRIM 800-160 MG PO TABS: 1.0000 | ORAL_TABLET | Freq: Two times a day (BID) | ORAL | Status: DC

## 2023-06-24 MED ORDER — CIPROFLOXACIN IN D5W 400 MG/200ML IV SOLN
INTRAVENOUS | Status: AC
  Filled 2023-06-24: qty 200

## 2023-06-24 MED ORDER — MONTELUKAST SODIUM 10 MG PO TABS: 10.0000 mg | ORAL_TABLET | Freq: Every day | ORAL | Status: DC

## 2023-06-24 MED ORDER — TICAGRELOR 60 MG PO TABS
60.0000 mg | ORAL_TABLET | Freq: Two times a day (BID) | ORAL | Status: DC
  Filled 2023-06-24: qty 1

## 2023-06-24 MED ORDER — ACETAMINOPHEN 325 MG PO TABS: 650.0000 mg | ORAL_TABLET | Freq: Four times a day (QID) | ORAL | Status: DC | PRN

## 2023-06-24 MED ORDER — KETOROLAC TROMETHAMINE 10 MG PO TABS: 10.0000 mg | ORAL_TABLET | Freq: Four times a day (QID) | ORAL | 0 refills | Status: AC

## 2023-06-24 MED ORDER — OXYCODONE HCL 5 MG PO TABS: 5.0000 mg | ORAL_TABLET | Freq: Once | ORAL | Status: DC | PRN

## 2023-06-24 MED ORDER — ALBUTEROL SULFATE (2.5 MG/3ML) 0.083% IN NEBU: 2.5000 mg | INHALATION_SOLUTION | RESPIRATORY_TRACT | Status: DC | PRN

## 2023-06-24 MED ORDER — SODIUM CHLORIDE 0.9 % IV SOLN: INTRAVENOUS | Status: DC

## 2023-06-24 MED ORDER — ONDANSETRON HCL 4 MG/2ML IJ SOLN
4.0000 mg | Freq: Four times a day (QID) | INTRAMUSCULAR | Status: DC | PRN
  Administered 2023-06-24 (×2): 4 mg via INTRAVENOUS
  Filled 2023-06-24: qty 2

## 2023-06-24 MED ORDER — FENTANYL CITRATE (PF) 250 MCG/5ML IJ SOLN
INTRAMUSCULAR | Status: DC | PRN
  Administered 2023-06-24 (×2): 25 ug via INTRAVENOUS

## 2023-06-24 MED ORDER — POTASSIUM CHLORIDE 10 MEQ/100ML IV SOLN
10.0000 meq | INTRAVENOUS | Status: DC
  Administered 2023-06-24: 10 meq via INTRAVENOUS
  Filled 2023-06-24: qty 100

## 2023-06-24 MED ORDER — PANTOPRAZOLE SODIUM 40 MG IV SOLR
40.0000 mg | INTRAVENOUS | Status: DC
Start: 2023-06-24 — End: 2023-06-24
  Administered 2023-06-24: 40 mg via INTRAVENOUS
  Filled 2023-06-24: qty 10

## 2023-06-24 MED ORDER — DEXAMETHASONE SODIUM PHOSPHATE 10 MG/ML IJ SOLN
INTRAMUSCULAR | Status: DC | PRN
  Administered 2023-06-24: 6 mg via INTRAVENOUS

## 2023-06-24 MED ORDER — LIDOCAINE HCL (PF) 2 % IJ SOLN
INTRAMUSCULAR | Status: DC | PRN
  Administered 2023-06-24: 60 mg via INTRADERMAL

## 2023-06-24 MED ORDER — MIDAZOLAM HCL 2 MG/2ML IJ SOLN
INTRAMUSCULAR | Status: AC
  Filled 2023-06-24: qty 2

## 2023-06-24 MED ORDER — TICAGRELOR 60 MG PO TABS: 60.0000 mg | ORAL_TABLET | Freq: Two times a day (BID) | ORAL | Status: DC

## 2023-06-24 MED ORDER — SODIUM CHLORIDE 0.9 % IV SOLN: 12.5000 mg | INTRAVENOUS | Status: DC | PRN

## 2023-06-24 MED ORDER — NALOXONE HCL 0.4 MG/ML IJ SOLN: 0.4000 mg | INTRAMUSCULAR | Status: DC | PRN

## 2023-06-24 MED ORDER — OXYBUTYNIN CHLORIDE 5 MG PO TABS: 5.0000 mg | ORAL_TABLET | Freq: Three times a day (TID) | ORAL | 0 refills | Status: AC | PRN

## 2023-06-24 MED ORDER — LEVOFLOXACIN 750 MG PO TABS
750.0000 mg | ORAL_TABLET | Freq: Every day | ORAL | Status: DC
  Administered 2023-06-24: 750 mg via ORAL
  Filled 2023-06-24: qty 1

## 2023-06-24 MED ORDER — ASPIRIN 81 MG PO CHEW: 81.0000 mg | CHEWABLE_TABLET | Freq: Every day | ORAL | Status: DC

## 2023-06-24 MED ORDER — FENTANYL CITRATE (PF) 100 MCG/2ML IJ SOLN
INTRAMUSCULAR | Status: AC
  Filled 2023-06-24: qty 2

## 2023-06-24 MED ORDER — PANTOPRAZOLE SODIUM 40 MG PO TBEC: 40.0000 mg | DELAYED_RELEASE_TABLET | Freq: Every day | ORAL | Status: DC

## 2023-06-24 MED ORDER — BUDESON-GLYCOPYRROL-FORMOTEROL 160-9-4.8 MCG/ACT IN AERO
2.0000 | INHALATION_SPRAY | Freq: Two times a day (BID) | RESPIRATORY_TRACT | Status: DC
  Administered 2023-06-24: 2 via RESPIRATORY_TRACT
  Filled 2023-06-24: qty 5.9

## 2023-06-24 MED ORDER — DOXYCYCLINE HYCLATE 100 MG PO TABS: 100.0000 mg | ORAL_TABLET | Freq: Two times a day (BID) | ORAL | 0 refills | Status: DC

## 2023-06-24 MED ORDER — KETOROLAC TROMETHAMINE 15 MG/ML IJ SOLN: 15.0000 mg | Freq: Four times a day (QID) | INTRAMUSCULAR | Status: DC | PRN

## 2023-06-24 MED ORDER — PROPOFOL 10 MG/ML IV BOLUS
INTRAVENOUS | Status: AC
  Filled 2023-06-24: qty 20

## 2023-06-24 MED ORDER — LACTATED RINGERS IV SOLN
INTRAVENOUS | Status: DC | PRN
Start: 2023-06-24 — End: 2023-06-24

## 2023-06-24 MED ORDER — OXYCODONE HCL 5 MG/5ML PO SOLN: 5.0000 mg | Freq: Once | ORAL | Status: DC | PRN

## 2023-06-24 MED ORDER — ACETAMINOPHEN 650 MG RE SUPP: 650.0000 mg | Freq: Four times a day (QID) | RECTAL | Status: DC | PRN

## 2023-06-24 MED ORDER — SODIUM CHLORIDE 0.9% FLUSH: 10.0000 mL | INTRAVENOUS | Status: DC | PRN

## 2023-06-24 MED ORDER — HYDROMORPHONE HCL 1 MG/ML IJ SOLN: 0.2500 mg | INTRAMUSCULAR | Status: DC | PRN

## 2023-06-24 MED ORDER — RANOLAZINE ER 500 MG PO TB12
500.0000 mg | ORAL_TABLET | Freq: Two times a day (BID) | ORAL | Status: DC
Start: 2023-06-24 — End: 2023-06-24
  Filled 2023-06-24: qty 1

## 2023-06-24 MED ORDER — CIPROFLOXACIN IN D5W 400 MG/200ML IV SOLN
INTRAVENOUS | Status: DC | PRN
  Administered 2023-06-24: 400 mg via INTRAVENOUS

## 2023-06-24 MED ORDER — SODIUM CHLORIDE 0.9 % IR SOLN
Status: DC | PRN
  Administered 2023-06-24: 3000 mL via INTRAVESICAL

## 2023-06-24 MED ORDER — FENTANYL CITRATE PF 50 MCG/ML IJ SOSY: 25.0000 ug | PREFILLED_SYRINGE | INTRAMUSCULAR | Status: DC | PRN

## 2023-06-24 MED ORDER — ACETAMINOPHEN 500 MG PO TABS: 1000.0000 mg | ORAL_TABLET | Freq: Four times a day (QID) | ORAL | 0 refills | Status: AC

## 2023-06-24 MED ORDER — ACETAMINOPHEN 10 MG/ML IV SOLN
INTRAVENOUS | Status: AC
  Filled 2023-06-24: qty 100

## 2023-06-24 MED ORDER — ROSUVASTATIN CALCIUM 10 MG PO TABS: 40.0000 mg | ORAL_TABLET | Freq: Every day | ORAL | Status: DC

## 2023-06-24 MED ORDER — TICAGRELOR 60 MG PO TABS: 60.0000 mg | ORAL_TABLET | Freq: Two times a day (BID) | ORAL | Status: AC

## 2023-06-24 MED ORDER — POTASSIUM CHLORIDE CRYS ER 20 MEQ PO TBCR
40.0000 meq | EXTENDED_RELEASE_TABLET | Freq: Once | ORAL | Status: AC
  Administered 2023-06-24: 40 meq via ORAL
  Filled 2023-06-24: qty 2

## 2023-06-24 MED ORDER — TAMSULOSIN HCL 0.4 MG PO CAPS: 0.4000 mg | ORAL_CAPSULE | Freq: Every day | ORAL | 0 refills | Status: AC

## 2023-06-24 MED ORDER — PROPOFOL 10 MG/ML IV BOLUS
INTRAVENOUS | Status: DC | PRN
  Administered 2023-06-24: 200 mg via INTRAVENOUS

## 2023-06-24 MED ORDER — ACETAMINOPHEN 10 MG/ML IV SOLN
INTRAVENOUS | Status: DC | PRN
  Administered 2023-06-24: 1000 mg via INTRAVENOUS

## 2023-06-24 MED ORDER — EPHEDRINE SULFATE-NACL 50-0.9 MG/10ML-% IV SOSY
PREFILLED_SYRINGE | INTRAVENOUS | Status: DC | PRN
Start: 2023-06-24 — End: 2023-06-24
  Administered 2023-06-24: 7.5 mg via INTRAVENOUS

## 2023-06-24 MED ORDER — SULFAMETHOXAZOLE-TRIMETHOPRIM 800-160 MG PO TABS: 1.0000 | ORAL_TABLET | Freq: Two times a day (BID) | ORAL | 0 refills | Status: AC

## 2023-06-24 SURGICAL SUPPLY — 22 items
BAG URO CATCHER STRL LF (MISCELLANEOUS) ×1 IMPLANT
BASKET STONE NCOMPASS (UROLOGICAL SUPPLIES) IMPLANT
BASKET ZERO TIP NITINOL 2.4FR (BASKET) IMPLANT
CATH URETERAL DUAL LUMEN 10F (MISCELLANEOUS) IMPLANT
CATH URETL OPEN 5X70 (CATHETERS) IMPLANT
CLOTH BEACON ORANGE TIMEOUT ST (SAFETY) ×1 IMPLANT
EXTRACTOR STONE NITINOL NGAGE (UROLOGICAL SUPPLIES) IMPLANT
GLOVE SURG SS PI 8.0 STRL IVOR (GLOVE) ×1 IMPLANT
GOWN STRL SURGICAL XL XLNG (GOWN DISPOSABLE) ×1 IMPLANT
GUIDEWIRE STR DUAL SENSOR (WIRE) ×1 IMPLANT
KIT BALLN UROMAX 15FX4 (MISCELLANEOUS) IMPLANT
KIT TURNOVER KIT A (KITS) IMPLANT
LASER FIB FLEXIVA PULSE ID 365 (Laser) IMPLANT
LASER FIB FLEXIVA PULSE ID 550 (Laser) IMPLANT
LASER FIB FLEXIVA PULSE ID 910 (Laser) IMPLANT
MANIFOLD NEPTUNE II (INSTRUMENTS) ×1 IMPLANT
PACK CYSTO (CUSTOM PROCEDURE TRAY) ×1 IMPLANT
SHEATH NAVIGATOR HD 11/13X36 (SHEATH) IMPLANT
STENT URET 6FRX22 CONTOUR (STENTS) IMPLANT
TRACTIP FLEXIVA PULS ID 200XHI (Laser) IMPLANT
TUBING CONNECTING 10 (TUBING) ×1 IMPLANT
TUBING UROLOGY SET (TUBING) ×1 IMPLANT

## 2023-06-24 NOTE — Op Note (Addendum)
 Preoperative diagnosis: left ureteral calculus  Postoperative diagnosis: left ureteral calculus  Procedure:  Cystoscopy left ureteroscopy and stone removal Application of fluoroscopy. left 60F x 22 ureteral stent placement  left retrograde pyelography with interpretation  Surgeon: Homero Luster, MD Assistant: Alphonza Ashing, MD  Anesthesia: General  Complications: None  Intraoperative findings:  - Mild cystocele - Orthotopic ureteral orifices, no bladder masses or stones - distal left stone seen on scout film - Large yellow stone just proximal to ureteral orifice, basket extracted - Successful placement of left 6x24 ureteral stent with string  EBL: Minimal  Specimens: left ureteral calculus given to patient  Indication: Toni Hughes is a 58 y.o.   patient with a 7mm left UVJ stone and associated left symptoms. After reviewing the management options for treatment, the patient elected to proceed with the above surgical procedure(s). We have discussed the potential benefits and risks of the procedure, side effects of the proposed treatment, the likelihood of the patient achieving the goals of the procedure, and any potential problems that might occur during the procedure or recuperation. Informed consent has been obtained.   Description of procedure:  The patient was taken to the operating room and general anesthesia was induced.  The patient was placed in the dorsal lithotomy position, prepped and draped in the usual sterile fashion, and preoperative antibiotics were administered. A preoperative time-out was performed.   Cystourethroscopy was performed.  The patient's urethra was examined and was normal. The bladder was then systematically examined in its entirety. There was no evidence for any bladder tumors, stones, or other mucosal pathology.    Attention then turned to the left ureteral orifice and a ureteral catheter was used to intubate the ureteral orifice.  Omnipaque contrast  was injected through the ureteral catheter and a retrograde pyelogram was performed with findings as dictated above.  A 0.38 sensor guidewire was then advanced up the left ureter into the renal pelvis under fluoroscopic guidance. The 6 Fr semirigid ureteroscope was then advanced into the ureter next to the guidewire and the calculus was identified.   The stone was basket extracted in its entirety and removed. Reinspection of the ureter revealed no remaining visible stones or fragments.   The wire was then backloaded through the cystoscope and a 6x22cm ureteral stent was advance over the wire using Seldinger technique.  The stent was positioned appropriately under fluoroscopic and cystoscopic guidance.  The wire was then removed with an adequate stent curl noted in the renal pelvis as well as in the bladder. Strings were tied and cut then tucked into the vagina.   The bladder was then emptied and the procedure ended.  The patient appeared to tolerate the procedure well and without complications.  The patient was able to be awakened and transferred to the recovery unit in satisfactory condition.   Disposition: Instructions for removing the stent have been provided to the patient, she will remove on 06/28/23 and remain on antibiotics until that time. She will be scheduled for follow up in 1-2 weeks.

## 2023-06-24 NOTE — Anesthesia Postprocedure Evaluation (Signed)
 Anesthesia Post Note  Patient: Toni Hughes  Procedure(s) Performed: CYSTOSCOPY/URETEROSCOPY/BASKET STONE EXTRACTION/STENT PLACEMENT (Left: Ureter)     Patient location during evaluation: PACU Anesthesia Type: General Level of consciousness: awake and alert Pain management: pain level controlled Vital Signs Assessment: post-procedure vital signs reviewed and stable Respiratory status: spontaneous breathing, nonlabored ventilation and respiratory function stable Cardiovascular status: blood pressure returned to baseline and stable Postop Assessment: no apparent nausea or vomiting Anesthetic complications: no   No notable events documented.  Last Vitals:  Vitals:   06/24/23 1330 06/24/23 1343  BP: 98/67 101/68  Pulse: 73 76  Resp: 13 14  Temp:    SpO2: 95% 96%    Last Pain:  Vitals:   06/24/23 1343  TempSrc:   PainSc: 0-No pain                 Earvin Goldberg

## 2023-06-24 NOTE — Care Management Obs Status (Signed)
 MEDICARE OBSERVATION STATUS NOTIFICATION   Patient Details  Name: Toni Hughes MRN: [id number] Date of Birth: 10-17-1965   Medicare Observation Status Notification Given:  Yes    Levie Ream, RN 06/24/2023, 6:04 PM

## 2023-06-24 NOTE — Plan of Care (Signed)

## 2023-06-24 NOTE — Care Management CC44 (Signed)
 Condition Code 44 Documentation Completed  Patient Details  Name: Toni Hughes MRN: [id number] Date of Birth: December 14, 1965   Condition Code 44 given:  Yes Patient signature on Condition Code 44 notice:  Yes Documentation of 2 MD's agreement:  Yes Code 44 added to claim:  Yes    Levie Ream, RN 06/24/2023, 6:04 PM

## 2023-06-24 NOTE — Anesthesia Preprocedure Evaluation (Addendum)
 Anesthesia Evaluation  Patient identified by MRN, date of birth, ID band Patient awake    Reviewed: Allergy & Precautions, H&P , NPO status , Patient's Chart, lab work & pertinent test results  Airway Mallampati: II  TM Distance: >3 FB Neck ROM: Full    Dental no notable dental hx.    Pulmonary asthma    Pulmonary exam normal breath sounds clear to auscultation       Cardiovascular hypertension, Pt. on medications + CAD and + Past MI  Normal cardiovascular exam Rhythm:Regular Rate:Normal     Neuro/Psych   Anxiety     negative neurological ROS  negative psych ROS   GI/Hepatic Neg liver ROS,GERD  ,,  Endo/Other  negative endocrine ROS    Renal/GU Renal disease  negative genitourinary   Musculoskeletal negative musculoskeletal ROS (+)    Abdominal   Peds negative pediatric ROS (+)  Hematology negative hematology ROS (+)   Anesthesia Other Findings   Reproductive/Obstetrics negative OB ROS                             Anesthesia Physical Anesthesia Plan  ASA: 3  Anesthesia Plan: General   Post-op Pain Management:    Induction: Intravenous  PONV Risk Score and Plan: 3 and Ondansetron, Dexamethasone, Midazolam and Treatment may vary due to age or medical condition  Airway Management Planned: LMA  Additional Equipment:   Intra-op Plan:   Post-operative Plan: Extubation in OR  Informed Consent: I have reviewed the patients History and Physical, chart, labs and discussed the procedure including the risks, benefits and alternatives for the proposed anesthesia with the patient or authorized representative who has indicated his/her understanding and acceptance.     Dental advisory given  Plan Discussed with: CRNA  Anesthesia Plan Comments:        Anesthesia Quick Evaluation

## 2023-06-24 NOTE — Anesthesia Procedure Notes (Signed)
 Procedure Name: LMA Insertion Date/Time: 06/24/2023 12:27 PM  Performed by: Raylene Calamity, CRNAPre-anesthesia Checklist: Patient identified, Emergency Drugs available, Suction available and Patient being monitored Patient Re-evaluated:Patient Re-evaluated prior to induction Oxygen Delivery Method: Circle System Utilized Preoxygenation: Pre-oxygenation with 100% oxygen Induction Type: IV induction Ventilation: Mask ventilation without difficulty LMA: LMA inserted LMA Size: 4.0 Number of attempts: 1 Airway Equipment and Method: Bite block Placement Confirmation: positive ETCO2 Tube secured with: Tape Dental Injury: Teeth and Oropharynx as per pre-operative assessment

## 2023-06-24 NOTE — Plan of Care (Signed)
  Problem: Education: Goal: Knowledge of General Education information will improve Description: Including pain rating scale, medication(s)/side effects and non-pharmacologic comfort measures 06/24/2023 1832 by Windsor Hatcher, RN Outcome: Adequate for Discharge 06/24/2023 1637 by Windsor Hatcher, RN Outcome: Progressing   Problem: Health Behavior/Discharge Planning: Goal: Ability to manage health-related needs will improve 06/24/2023 1832 by Windsor Hatcher, RN Outcome: Adequate for Discharge 06/24/2023 1637 by Windsor Hatcher, RN Outcome: Progressing   Problem: Clinical Measurements: Goal: Ability to maintain clinical measurements within normal limits will improve 06/24/2023 1832 by Windsor Hatcher, RN Outcome: Adequate for Discharge 06/24/2023 1637 by Windsor Hatcher, RN Outcome: Progressing Goal: Will remain free from infection 06/24/2023 1832 by Windsor Hatcher, RN Outcome: Adequate for Discharge 06/24/2023 1637 by Windsor Hatcher, RN Outcome: Progressing Goal: Diagnostic test results will improve 06/24/2023 1832 by Windsor Hatcher, RN Outcome: Adequate for Discharge 06/24/2023 1637 by Windsor Hatcher, RN Outcome: Progressing Goal: Respiratory complications will improve 06/24/2023 1832 by Windsor Hatcher, RN Outcome: Adequate for Discharge 06/24/2023 1637 by Windsor Hatcher, RN Outcome: Progressing Goal: Cardiovascular complication will be avoided 06/24/2023 1832 by Windsor Hatcher, RN Outcome: Adequate for Discharge 06/24/2023 1637 by Windsor Hatcher, RN Outcome: Progressing   Problem: Activity: Goal: Risk for activity intolerance will decrease 06/24/2023 1832 by Windsor Hatcher, RN Outcome: Adequate for Discharge 06/24/2023 1637 by Windsor Hatcher, RN Outcome: Progressing   Problem: Nutrition: Goal: Adequate nutrition will be maintained 06/24/2023 1832 by Windsor Hatcher, RN Outcome: Adequate for Discharge 06/24/2023 1637 by Windsor Hatcher, RN Outcome: Progressing    Problem: Coping: Goal: Level of anxiety will decrease 06/24/2023 1832 by Windsor Hatcher, RN Outcome: Adequate for Discharge 06/24/2023 1637 by Windsor Hatcher, RN Outcome: Progressing   Problem: Elimination: Goal: Will not experience complications related to bowel motility 06/24/2023 1832 by Windsor Hatcher, RN Outcome: Adequate for Discharge 06/24/2023 1637 by Windsor Hatcher, RN Outcome: Progressing Goal: Will not experience complications related to urinary retention 06/24/2023 1832 by Windsor Hatcher, RN Outcome: Adequate for Discharge 06/24/2023 1637 by Windsor Hatcher, RN Outcome: Progressing   Problem: Pain Managment: Goal: General experience of comfort will improve and/or be controlled 06/24/2023 1832 by Windsor Hatcher, RN Outcome: Adequate for Discharge 06/24/2023 1637 by Windsor Hatcher, RN Outcome: Progressing   Problem: Safety: Goal: Ability to remain free from injury will improve 06/24/2023 1832 by Windsor Hatcher, RN Outcome: Adequate for Discharge 06/24/2023 1637 by Windsor Hatcher, RN Outcome: Progressing   Problem: Skin Integrity: Goal: Risk for impaired skin integrity will decrease 06/24/2023 1832 by Windsor Hatcher, RN Outcome: Adequate for Discharge 06/24/2023 1637 by Windsor Hatcher, RN Outcome: Progressing

## 2023-06-24 NOTE — Discharge Summary (Signed)
 Physician Discharge Summary  Toni Hughes QMV:[id number] DOB: 09-23-1965 DOA: 06/23/2023  PCP: Toni Friend, PA-C  Admit date: 06/23/2023 Discharge date: 06/24/2023  Time spent: 40 minutes  Recommendations for Outpatient Follow-up:  Follow outpatient CBC/CMP  Follow urine culture - adjust bactrim as indicated based on culture results  Urology follow outpatient  Discharge Diagnoses:  Principal Problem:   Left nephrolithiasis Active Problems:   Moderate persistent asthma   GERD (gastroesophageal reflux disease)   Acute cystitis   Obstructive uropathy   Hypokalemia   Dehydration   Acute prerenal azotemia   High anion gap metabolic acidosis   Essential hypertension   HLD (hyperlipidemia)   Acute left flank pain   Discharge Condition: stable  Diet recommendation: heart healthy  Filed Weights   06/23/23 1917 06/24/23 0057 06/24/23 0452  Weight: 66.7 kg 66.7 kg 66.6 kg    History of present illness:   Toni Hughes is Toni Hughes 58 y.o. female with medical history significant for essential pretension, hyperlipidemia, moderate persistent asthma, who is admitted to [facility] on 06/23/2023 by way of transfer from [facility] with infected obstructing left ureterolithiasis after presenting from home to the latter facility complaining of left flank pain.    Now s/p stone removal and stent placement by urology.  Stable for discharge per discussion with urology.  Will need to follow outpatient urine cultures.   Hospital Course:  Assessment and Plan:  Left Hydroureteronephrosis  UTI with Obstructing Stone CT with left hydroureteronephrosis UA concerning for UTI with positive nitrite/LE Urine culture pending (has urine culture from [place] from Friday pending as well) Now s/p left ureteroscopy and stone removal, ureteroscopic laser lithotripsy, L ureteral stent placement 5/4 Urology ok with discharge today Will transition to bactrim, adjust abx further based on culture  results - will need to follow outpatient to ensure bactrim doesn't need to be adjusted   Hypokalemia Replaced this AM, follow outpatient    AGMA Resolved   Hypertension Resume home meds at discharge (amlodipine 10 mg, coreg, hydralazine 25 mg BID, irbesartan)   Dyslipidemia Statin   CAD  Ischemic Cardiomyopathy Ranexa, statin, aspirin.  Notably, cardiology note from 05/2023 mentions she should be on DAPT with brilinta.  Will clarify.  Urology ok to resume in DAPT tmrw.   Asthma Singulair  Nebs No exacerbation   Hx Sarcoidosis      Procedures:  Procedure:   Cystoscopy left ureteroscopy and stone removal Application of fluoroscopy. left 15F x 22 ureteral stent placement  left retrograde pyelography with interpretation  Consultations: urology  Discharge Exam: Vitals:   06/24/23 1343 06/24/23 1410  BP: 101/68 125/80  Pulse: 76 (!) 56  Resp: 14 14  Temp:  (!) 97.4 F (36.3 C)  SpO2: 96% 96%   No new complaints Did ok with meal after procedure, wants to go home  See exam from PN  Discharge Instructions   Discharge Instructions     Call MD for:  difficulty breathing, headache or visual disturbances   Complete by: As directed    Call MD for:  extreme fatigue   Complete by: As directed    Call MD for:  hives   Complete by: As directed    Call MD for:  persistant dizziness or light-headedness   Complete by: As directed    Call MD for:  persistant nausea and vomiting   Complete by: As directed    Call MD for:  redness, tenderness, or signs of infection (pain, swelling, redness, odor or  green/yellow discharge around incision site)   Complete by: As directed    Call MD for:  severe uncontrolled pain   Complete by: As directed    Call MD for:  temperature >100.4   Complete by: As directed    Diet - low sodium heart healthy   Complete by: As directed    Discharge instructions   Complete by: As directed    You were seen for [name] UTI with an obstructing  stone.  Urology removed the stone and placed [name] stent.  You've been started on flomax.  Continue this.  You've been started on antibiotics with bactrim which treats most urine infections.  Importantly, we don't know the bacteria growing yet.  You'll need to follow the final urine culture with your outpatient doctor and ensure that bactrim is an appropriate antibiotic.    You can return to work on 5/6.  Don't lift anything heavier than 10 lbs for 4 days.   Return for new, recurrent, or worsening symptoms.  Please ask your PCP to request records from this hospitalization so they know what was done and what the next steps will be.   Increase activity slowly   Complete by: As directed       Allergies as of 06/24/2023       Reactions   Codeine Hives   Paranoid   Diphenhydramine Hcl Hives   Other Hives, Itching   Peanuts, tree nuts   Terbinafine Hives, Itching   Skin discoloration   Lamisil  [terbinafine] Hives   Penicillins Hives        Medication List     STOP taking these medications    isosorbide mononitrate 60 MG 24 hr tablet Commonly known as: IMDUR   losartan 50 MG tablet Commonly known as: COZAAR   meloxicam 7.5 MG tablet Commonly known as: MOBIC   metoprolol tartrate 25 MG tablet Commonly known as: LOPRESSOR   nitroGLYCERIN 0.4 mg/hr patch Commonly known as: NITRODUR - Dosed in mg/24 hr       TAKE these medications    acetaminophen  500 MG tablet Commonly known as: TYLENOL  Take 2 tablets (1,000 mg total) by mouth every 6 (six) hours for 7 days.   albuterol  108 (90 Base) MCG/ACT inhaler Commonly known as: Ventolin  HFA 2 puffs every 4 hours if needed for cough or wheeze.   amLODipine 10 MG tablet Commonly known as: NORVASC Take by mouth.   aspirin 81 MG chewable tablet Chew by mouth.   Breztri  Aerosphere 160-9-4.8 MCG/ACT Aero inhaler Generic drug: budeson-glycopyrrolate-formoterol  Inhale 2 puffs into the lungs in the morning and at bedtime.    carvedilol 6.25 MG tablet Commonly known as: COREG Take 6.25 mg by mouth 2 (two) times daily.   cetirizine 10 MG tablet Commonly known as: ZYRTEC Take by mouth.   EPINEPHrine  0.3 mg/0.3 mL Soaj injection Commonly known as: Auvi-Q  Use as directed for severe allergic reaction.   EPINEPHrine  0.3 mg/0.3 mL Soaj injection Commonly known as: EpiPen  2-Pak Inject 0.3 mg into the muscle as needed for anaphylaxis.   hydrALAZINE 25 MG tablet Commonly known as: APRESOLINE Take 1 tablet (25 mg total) by mouth 2 (two) times daily. What changed:  how much to take when to take this   ipratropium 0.03 % nasal spray Commonly known as: ATROVENT  USE 2 SPRAYS IN EACH NOSTRIL THREE TIMES DAILY as needed. Use less frequently if nose becomes too dry.   irbesartan 150 MG tablet Commonly known as: Avapro Take 1 tablet (150  mg total) by mouth daily.   ketorolac 10 MG tablet Commonly known as: TORADOL Take 1 tablet (10 mg total) by mouth every 6 (six) hours for 3 days.   montelukast  10 MG tablet Commonly known as: SINGULAIR  Take 1 tablet (10 mg total) by mouth daily.   Oncovite Tabs Take by mouth.   oxybutynin 5 MG tablet Commonly known as: DITROPAN Take 1 tablet (5 mg total) by mouth 3 (three) times daily as needed for up to 7 days for bladder spasms.   pantoprazole 40 MG tablet Commonly known as: PROTONIX Take 40 mg by mouth daily.   ranolazine 500 MG 12 hr tablet Commonly known as: RANEXA Take by mouth.   rosuvastatin 40 MG tablet Commonly known as: CRESTOR Take by mouth.   sulfamethoxazole-trimethoprim 800-160 MG tablet Commonly known as: BACTRIM DS Take 1 tablet by mouth every 12 (twelve) hours for 7 days. Follow up with PCP/urology to ensure antibiotics are appropriate based on culture results Start taking on: Jun 25, 2023   tamsulosin 0.4 MG Caps capsule Commonly known as: FLOMAX Take 1 capsule (0.4 mg total) by mouth at bedtime for 7 days.   ticagrelor 60 MG Tabs  tablet Commonly known as: BRILINTA Take 1 tablet (60 mg total) by mouth 2 (two) times daily. Start taking on: Jun 25, 2023       Allergies  Allergen Reactions   Codeine Hives    Paranoid   Diphenhydramine Hcl Hives   Other Hives and Itching    Peanuts, tree nuts   Terbinafine Hives and Itching    Skin discoloration   Lamisil  [Terbinafine] Hives   Penicillins Hives    Follow-up Information     [facility] Follow up.   Why: The office should call to arrange follow up but if you have not heard from us  by Tuesday, please contact the office to make an appointment for 1-2 weeks from the procedure. Contact information: [address] [place]  [address] [phone]                 The results of significant diagnostics from this hospitalization (including imaging, microbiology, ancillary and laboratory) are listed below for reference.    Significant Diagnostic Studies: DG C-Arm 1-60 Min-No Report Result Date: 06/24/2023 Fluoroscopy was utilized by the requesting physician.  No radiographic interpretation.   DG Abd 1 View Result Date: 06/24/2023 CLINICAL DATA:  Left UVJ stone. EXAM: ABDOMEN - 1 VIEW COMPARISON:  None Available. FINDINGS: The bowel gas pattern is normal. 7 mm calcification overlying the left pelvis consistent with the UVJ stone as depicted on prior CT. IMPRESSION: Left UVJ stone. Electronically Signed   By: Sydell Eva M.D.   On: 06/24/2023 09:38   CT Renal Stone Study Result Date: 06/23/2023 CLINICAL DATA:  Left flank pain, stone suspected EXAM: CT ABDOMEN AND PELVIS WITHOUT CONTRAST TECHNIQUE: Multidetector CT imaging of the abdomen and pelvis was performed following the standard protocol without IV contrast. RADIATION DOSE REDUCTION: This exam was performed according to the departmental dose-optimization program which includes automated exposure control, adjustment of the mA and/or kV according to patient size and/or use  of iterative reconstruction technique. COMPARISON:  None Available. FINDINGS: Lower chest: No acute abnormality. Hepatobiliary: Unremarkable liver. Normal gallbladder. No biliary dilation. Pancreas: Unremarkable. Spleen: Unremarkable. Adrenals/Urinary Tract: Normal adrenal glands. Mild left hydroureteronephrosis. 6-7 mm calcification near the expected location of the left UVJ (series 2/image 73) is suspected to represent an obstructing stone.  Evaluation is limited by streak artifact from the left THA. Additional punctate nonobstructing left nephrolithiasis. No urinary calculi or hydronephrosis on the right. Bladder is unremarkable. Stomach/Bowel: Normal caliber large and small bowel. Extensive colonic diverticulosis without diverticulitis no bowel wall thickening. The appendix is normal. Small hiatal hernia. Vascular/Lymphatic: Aortic atherosclerosis. No enlarged abdominal or pelvic lymph nodes. Reproductive: Unremarkable. Other: No free intraperitoneal fluid or air. Musculoskeletal: No acute fracture.  Left THA. IMPRESSION: 1. Mild left hydroureteronephrosis upstream from Germaine Ripp 6-7 mm stone at the left UVJ. 2. Additional punctate nonobstructing left nephrolithiasis. 3. Extensive colonic diverticulosis without diverticulitis. 4. Aortic Atherosclerosis (ICD10-I70.0). Electronically Signed   By: Rozell Cornet M.D.   On: 06/23/2023 21:33    Microbiology: No results found for this or any previous visit (from the past 240 hours).   Labs: Basic Metabolic Panel: Recent Labs  Lab 06/23/23 2040 06/24/23 0236  NA 142 141  K 3.2* 3.1*  CL 103 104  CO2 21* 23  GLUCOSE 115* 114*  BUN 19 16  CREATININE 0.89 0.62  CALCIUM 10.3 9.0  MG  --  1.9   Liver Function Tests: Recent Labs  Lab 06/23/23 2040 06/24/23 0236  AST 15 13*  ALT 7 11  ALKPHOS 96 68  BILITOT 0.5 1.1  PROT 7.6 6.8  ALBUMIN 4.3 3.7   Recent Labs  Lab 06/23/23 2040  LIPASE 24   No results for input(s): "AMMONIA" in the last 168  hours. CBC: Recent Labs  Lab 06/23/23 2040 06/24/23 0236  WBC 12.4* 7.8  NEUTROABS 10.1* 4.9  HGB 13.5 12.7  HCT 40.6 39.5  MCV 92.1 95.6  PLT 304 252   Cardiac Enzymes: No results for input(s): "CKTOTAL", "CKMB", "CKMBINDEX", "TROPONINI" in the last 168 hours. BNP: BNP (last 3 results) No results for input(s): "BNP" in the last 8760 hours.  ProBNP (last 3 results) No results for input(s): "PROBNP" in the last 8760 hours.  CBG: No results for input(s): "GLUCAP" in the last 168 hours.     Signed:  Donnetta Gains MD.  [facility] 06/24/2023, 5:37 PM

## 2023-06-24 NOTE — H&P (Signed)
 History and Physical      Toni Hughes WUX:[id number] DOB: 1965/10/15 DOA: 06/23/2023; DOS: 06/24/2023  PCP: Domingo Friend, PA-C  Patient coming from: home   I have personally briefly reviewed patient's old medical records in [facility]  Chief Complaint: Left-sided flank pain  HPI: Toni Hughes is a 58 y.o. female with medical history significant for essential pretension, hyperlipidemia, moderate persistent asthma, who is admitted to [facility] on 06/23/2023 by way of transfer from [facility] with infected obstructing left ureterolithiasis after presenting from home to the latter facility complaining of left flank pain.   The patient reports 2 to 3 days of progressive sharp, nonradiating left flank discomfort associated with dysuria as well as intermittent nausea in the absence of vomiting.  In the setting of this intermittent nausea, patient conveys recent decline in oral intake.  She denies any associated subjective fever, chills, rigors, or generalized myalgias.  Denies any associated diarrhea, cough, shortness of breath nor any recent chest pain, orthopnea, PND, or worsening of peripheral edema.  She is on a daily baby aspirin at home, but otherwise on no additional blood thinners at home.  Of note, the patient has a reported history of allergy to penicillin, noting that she has previously experienced hives with penicillin.     [facility] ED Course:  Vital signs in the ED were notable for the following: Afebrile; heart rates in the 50s to 80s; systolic blood pressures in the 140s to 150s; respiratory rate 14-18, oxygen saturation 94 to 97% on room air.  Labs were notable for the following: CMP notable for the following: Potassium 3.2, bicarbonate 21, anion gap 18, creatinine 0.89 compared to most recent prior creatinine data point is 0.64 on 02/16/2023, BUN/creatinine ratio 21.3, glucose 115, liver enzymes were within normal limits.  Lipase 24.  Lactic  acid 0.4.  CBC notable for white cell count 12,400 with 82% neutrophils, hemoglobin 13.5, platelet count 304.  Urinalysis was associated with a hazy appearing specimen and notable for 11-20 white blood cells, nitrate positive, small leukocyte Estrace and no evidence of squamous epithelial cells, will also demonstrating 21-50 RBCs.  Urine culture was collected prior to initiation of IV antibiotics.  Per my interpretation, EKG in ED demonstrated the following: No EKG performed in the ED today.  Imaging in the ED, per corresponding formal radiology read, was notable for the following: CT renal stone study showed mild left hydroureteronephrosis upstream from a 6 to 7 mm stone at the left UVJ.  This imaging also showed extensive colonic diverticulosis without corresponding evidence of diverticulitis.  No evidence of bowel obstruction or abscess.  EDP at [facility] d/w on-call urology, Dr. Inga Manges, who recommended admission to hospitalists at [facility] and conveyed that urology will formally consult and see the patient in the morning.  Dr. Inga Manges also requested that the patient be kept n.p.o. starting at midnight in case the patient subsequently needs to undergo urologic procedure.    While in the ED, the following were administered: Toradol 15 mg IV x 1 dose, Flomax 0.4 mg p.o. x 1 dose, Levaquin 500 milligrams IV x 1 dose.  Subsequently, the patient was admitted to [facility] for further evaluation and management of presenting infected obstructing left ureterolithiasis, with presenting labs also notable for hypokalemia as well as anion gap metabolic acidosis, along with clinical evidence to suggest dehydration.    Review of Systems: As per HPI otherwise 10 point review of systems negative.  Past Medical History:  Diagnosis Date   Allergy    Anxiety    Asthma    Heart disease    Hyperlipidemia    Hypertension    Recurrent upper respiratory infection (URI)    Sarcoidosis    Urticaria     Past Surgical  History:  Procedure Laterality Date   BREAST LUMPECTOMY Right 10/2015   CARDIAC CATHETERIZATION  04/2015   TOTAL HIP ARTHROPLASTY Left     Social History:  reports that she has never smoked. She has never used smokeless tobacco. She reports that she does not drink alcohol and does not use drugs.   Allergies  Allergen Reactions   Codeine Hives    Paranoid   Diphenhydramine Hcl Hives   Other Hives and Itching    Peanuts, tree nuts   Terbinafine Hives and Itching    Skin discoloration   Lamisil  [Terbinafine] Hives   Penicillins Hives    Family History  Problem Relation Age of Onset   Asthma Mother    Allergic rhinitis Sister    Asthma Sister    Allergic rhinitis Brother    Migraines Brother    Allergic rhinitis Daughter    Asthma Daughter    Eczema Son    Allergic rhinitis Son    Sinusitis Son    Urticaria Neg Hx     Family history reviewed and not pertinent    Prior to Admission medications   Medication Sig Start Date End Date Taking? Authorizing Provider  albuterol  (VENTOLIN  HFA) 108 (90 Base) MCG/ACT inhaler 2 puffs every 4 hours if needed for cough or wheeze. 03/20/22  Yes Sean Czar, MD  amLODipine (NORVASC) 10 MG tablet Take by mouth.   Yes [provider]  aspirin 81 MG chewable tablet Chew by mouth.   Yes [provider]  Budeson-Glycopyrrol-Formoterol  (BREZTRI  AEROSPHERE) 160-9-4.8 MCG/ACT AERO Inhale 2 puffs into the lungs in the morning and at bedtime. 03/20/22  Yes Sean Czar, MD  carvedilol (COREG) 6.25 MG tablet Take 6.25 mg by mouth 2 (two) times daily. 11/11/20  Yes [provider]  cetirizine (ZYRTEC) 10 MG tablet Take by mouth.   Yes [provider]  hydrALAZINE (APRESOLINE) 25 MG tablet Take by mouth. 02/27/18  Yes [provider]  ipratropium (ATROVENT ) 0.03 % nasal spray USE 2 SPRAYS IN EACH NOSTRIL THREE TIMES DAILY as needed. Use less frequently if nose becomes too dry. 03/20/22  Yes Sean Czar,  MD  meloxicam (MOBIC) 7.5 MG tablet Take 7.5 mg by mouth daily. 11/22/20  Yes [provider]  montelukast  (SINGULAIR ) 10 MG tablet Take 1 tablet (10 mg total) by mouth daily. 03/20/22  Yes Sean Czar, MD  Multiple Vitamins-Minerals (ONCOVITE) TABS Take by mouth.   Yes [provider]  pantoprazole (PROTONIX) 40 MG tablet Take 40 mg by mouth daily. 10/18/20  Yes [provider]  ranolazine (RANEXA) 500 MG 12 hr tablet Take by mouth. 02/27/18  Yes [provider]  EPINEPHrine  (AUVI-Q ) 0.3 mg/0.3 mL IJ SOAJ injection Use as directed for severe allergic reaction. 03/20/22   Sean Czar, MD  EPINEPHrine  (EPIPEN  2-PAK) 0.3 mg/0.3 mL IJ SOAJ injection Inject 0.3 mg into the muscle as needed for anaphylaxis. 03/23/22   Sean Czar, MD  isosorbide mononitrate (IMDUR) 60 MG 24 hr tablet Take by mouth. 11/26/15 11/25/16  [provider]  losartan (COZAAR) 50 MG tablet Take by mouth. 12/06/21   [provider]  metoprolol  tartrate (LOPRESSOR) 25 MG tablet Take by mouth. 11/26/15 11/25/16  [provider]  nitroGLYCERIN (NITRODUR - DOSED IN MG/24 HR) 0.4 mg/hr patch Place 0.4 mg onto the skin daily.    [provider]  rosuvastatin (CRESTOR) 40 MG tablet Take by mouth. 11/26/15 11/25/16  [provider]     Objective    Physical Exam: Vitals:   06/23/23 1920 06/23/23 2238 06/24/23 0001 06/24/23 0057  BP: (!) 149/104 (!) 170/95 (!) 155/99 (!) 149/86  Pulse: 80 (!) 56 85 64  Resp: 16 14 15 18   Temp: 98.3 F (36.8 C) 98.6 F (37 C) 98.5 F (36.9 C) 97.7 F (36.5 C)  TempSrc: Oral Oral Oral Oral  SpO2: 94% 97% 97% 97%  Weight:    66.7 kg  Height:    5' 1.5" (1.562 m)    General: appears to be stated age; alert, oriented Skin: warm, dry, no rash Head:  AT/[place] Mouth:  Oral mucosa membranes appear dry, normal dentition Neck: supple; trachea midline Heart:  RRR; did not appreciate any M/R/G Lungs: CTAB, did not  appreciate any wheezes, rales, or rhonchi Abdomen: + BS; soft, ND, left flank tenderness noted, while otherwise NT Vascular: 2+ pedal pulses b/l; 2+ radial pulses b/l Extremities: no peripheral edema, no muscle wasting Neuro: strength and sensation intact in upper and lower extremities b/l    Labs on Admission: I have personally reviewed following labs and imaging studies  CBC: Recent Labs  Lab 06/23/23 2040  WBC 12.4*  NEUTROABS 10.1*  HGB 13.5  HCT 40.6  MCV 92.1  PLT 304   Basic Metabolic Panel: Recent Labs  Lab 06/23/23 2040  NA 142  K 3.2*  CL 103  CO2 21*  GLUCOSE 115*  BUN 19  CREATININE 0.89  CALCIUM 10.3   GFR: Estimated Creatinine Clearance: 60.3 mL/min (by C-G formula based on SCr of 0.89 mg/dL). Liver Function Tests: Recent Labs  Lab 06/23/23 2040  AST 15  ALT 7  ALKPHOS 96  BILITOT 0.5  PROT 7.6  ALBUMIN 4.3   Recent Labs  Lab 06/23/23 2040  LIPASE 24   No results for input(s): "AMMONIA" in the last 168 hours. Coagulation Profile: No results for input(s): "INR", "PROTIME" in the last 168 hours. Cardiac Enzymes: No results for input(s): "CKTOTAL", "CKMB", "CKMBINDEX", "TROPONINI" in the last 168 hours. BNP (last 3 results) No results for input(s): "PROBNP" in the last 8760 hours. HbA1C: No results for input(s): "HGBA1C" in the last 72 hours. CBG: No results for input(s): "GLUCAP" in the last 168 hours. Lipid Profile: No results for input(s): "CHOL", "HDL", "LDLCALC", "TRIG", "CHOLHDL", "LDLDIRECT" in the last 72 hours. Thyroid Function Tests: No results for input(s): "TSH", "T4TOTAL", "FREET4", "T3FREE", "THYROIDAB" in the last 72 hours. Anemia Panel: No results for input(s): "VITAMINB12", "FOLATE", "FERRITIN", "TIBC", "IRON", "RETICCTPCT" in the last 72 hours. Urine analysis:    Component Value Date/Time   COLORURINE YELLOW 06/23/2023 2024   APPEARANCEUR HAZY (A) 06/23/2023 2024   LABSPEC 1.020 06/23/2023 2024   PHURINE 6.5  06/23/2023 2024   GLUCOSEU NEGATIVE 06/23/2023 2024   HGBUR LARGE (A) 06/23/2023 2024   BILIRUBINUR NEGATIVE 06/23/2023 2024   KETONESUR NEGATIVE 06/23/2023 2024   PROTEINUR NEGATIVE 06/23/2023 2024   NITRITE POSITIVE (A) 06/23/2023 2024   LEUKOCYTESUR SMALL (A) 06/23/2023 2024    Radiological Exams on Admission: CT Renal Stone Study Result Date: 06/23/2023 CLINICAL DATA:  Left flank pain, stone suspected EXAM: CT ABDOMEN AND PELVIS WITHOUT CONTRAST TECHNIQUE:  Multidetector CT imaging of the abdomen and pelvis was performed following the standard protocol without IV contrast. RADIATION DOSE REDUCTION: This exam was performed according to the departmental dose-optimization program which includes automated exposure control, adjustment of the mA and/or kV according to patient size and/or use of iterative reconstruction technique. COMPARISON:  None Available. FINDINGS: Lower chest: No acute abnormality. Hepatobiliary: Unremarkable liver. Normal gallbladder. No biliary dilation. Pancreas: Unremarkable. Spleen: Unremarkable. Adrenals/Urinary Tract: Normal adrenal glands. Mild left hydroureteronephrosis. 6-7 mm calcification near the expected location of the left UVJ (series 2/image 73) is suspected to represent an obstructing stone. Evaluation is limited by streak artifact from the left THA. Additional punctate nonobstructing left nephrolithiasis. No urinary calculi or hydronephrosis on the right. Bladder is unremarkable. Stomach/Bowel: Normal caliber large and small bowel. Extensive colonic diverticulosis without diverticulitis no bowel wall thickening. The appendix is normal. Small hiatal hernia. Vascular/Lymphatic: Aortic atherosclerosis. No enlarged abdominal or pelvic lymph nodes. Reproductive: Unremarkable. Other: No free intraperitoneal fluid or air. Musculoskeletal: No acute fracture.  Left THA. IMPRESSION: 1. Mild left hydroureteronephrosis upstream from a 6-7 mm stone at the left UVJ. 2. Additional  punctate nonobstructing left nephrolithiasis. 3. Extensive colonic diverticulosis without diverticulitis. 4. Aortic Atherosclerosis (ICD10-I70.0). Electronically Signed   By: Rozell Cornet M.D.   On: 06/23/2023 21:33      Assessment/Plan    Principal Problem:   Left nephrolithiasis Active Problems:   Moderate persistent asthma   GERD (gastroesophageal reflux disease)   Acute cystitis   Obstructive uropathy   Hypokalemia   Dehydration   Acute prerenal azotemia   High anion gap metabolic acidosis   Essential hypertension   HLD (hyperlipidemia)   Acute left flank pain    #) Infected obstructing left ureterolithiasis: Diagnosis on the basis of 2 to 3 days of progressive left flank discomfort with CT renal stone showing evidence of left hydroureteronephrosis upstream from a 6 to 7 mm stone at the left UVJ.  This appears to be associated with urinary tract infection, in the setting of recent dysuria as well as urinalysis with borderline elevation in white blood cell count as well as the presence of nitrates and small leukocyte esterase, without any evidence of squamous epithelial cells to suggest a contaminated specimen.  There is potential false lobe associated with the degree of her pyuria in the context of her presenting obstructing left ureterolithiasis.  Of note, presentation is associated with leukocytosis with neutrophil predominance.  However, no additional SIRS criteria met at this time.  Consequently, SIRS criteria are not currently met for sepsis.  Sending lactic acid nonelevated at 0.4.  Additionally, she appears hemodynamically stable.  Urine culture was collected prior to initiation of IV Levaquin, which was selected in the setting of a reported history of allergies to penicillin, as above.  EDP at [facility] d/w on-call urology, Dr. Inga Manges, who recommended admission to hospitalists at [facility] and conveyed that urology will formally consult and see the patient in the morning.  Dr. Inga Manges  also requested that the patient be kept n.p.o. starting at midnight in case the patient subsequently needs to undergo urologic procedure.    Venice Gillis Score for this patient in the context of anticipated aforementioned surgery conveys a  0.2% perioperative risk for significant cardiac event. No evidence to suggest acutely decompensated heart failure or acute MI. Consequently, no absolute contraindications to proceeding with proposed surgery at this time.   Plan: Continuous NS at 75 cc/h x 12 hours.  Follow-up for result of urine culture.  Levaquin, as above.  Repeat CBC with differential in the morning.  Neurology to formally consult, as above.  N.p.o. after midnight, per urology request.  Hold home daily baby aspirin for now.  Prn IV fentanyl for severe pain.  Prn IV Toradol for moderate pain.  Prn IV Zofran.  Check INR, preoperative EKG.  Type and screen.  Continue Flomax.                     #) Hypokalemia: presenting potassium level noted to be 3.2, with likely contribution from recent decline in oral intake in the setting of intermittent nausea over the last few days as a consequence of her presenting infected obstructing ureterolithiasis.  As the patient is currently n.p.o., will pursue IV potassium supplementation.  Plan:  KCl 40 meq IV over 4 hours. Add-on serum mag level. CMP, mag level in the AM.                          #) Dehydration: Clinical suspicion for such, including the appearance of dry oral mucous membranes as well as laboratory findings notable for acute prerenal azotemia.  Appears to be in the setting of   recent decline in oral intake as a consequence of intermittent nausea over the last few days.  No e/o associated hypotension.   Plan: Monitor strict I's and O's.  Daily weights.  CMP in the morning. IVF's in form of normal saline at 75 cc/h x 12 hours.                    #) Anion gap metabolic acidosis: Presenting CMP  reflects mild anion gap metabolic acidosis, as further quantified above.  Suspect that this is multifactorial, with suspected contributions from presenting dehydration, interval decline in renal function relative to creatinine data point from December 2024, although this interval increase in creatinine does not meet quantitative threshold for definition of acute kidney injury.  Acute infection in the form of infected left kidney stent is noted, although lactic acid is nonelevated and criteria for sepsis are not currently met.  No elevation in glucose to suggest DKA.  Will check INR to evaluate hepatic synthetic function.  Plan: IV fluids, as above.  Repeat CMP in the morning.  Check INR.  Further evaluation management of presenting dehydration as above.  Further evaluation management of infected obstructing left-sided ureterolithiasis, as above.  Monitor strict I's and O's and daily weights.                     #) Essential Hypertension: documented h/o such, with outpatient antihypertensive regimen including amlodipine, Coreg.  SBP's in the ED today: 140s to 150s mmHg.   Plan: Close monitoring of subsequent BP via routine VS. in the setting of current n.p.o. status, will hold home antihypertensive medications for now.                     #) Hyperlipidemia: documented h/o such. On rosuvastatin as outpatient.   Plan: Hold home statin for now in the setting of current n.p.o. status.                     #) Moderate persistent asthma: documented history thereof, without clinical e/o to suggest current exacerbation. Outpatient respiratory regimen includes Breztri , as needed albuterol  inhaler as well as Singulair .   Plan: Check serum magnesium level. Continue home Breztri .  Prn albuterol  nebulizer.  Hold home Singulair  in the context of current n.p.o. status.                      #) GERD: documented h/o such; on Protonix as  outpatient.   Plan: In the context of current n.p.o. status, will transiently convert her to daily oral Protonix to daily IV Protonix.     DVT prophylaxis: SCD's   Code Status: Full code Family Communication: none Disposition Plan: Per Rounding Team Consults called: EDP at [facility] d/w on-call urology, Dr. Inga Manges, who recommended admission to hospitalists at [facility] and conveyed that urology will formally consult and see the patient in the morning. ;  Admission status: Inpatient     I SPENT GREATER THAN 75  MINUTES IN CLINICAL CARE TIME/MEDICAL DECISION-MAKING IN COMPLETING THIS ADMISSION.     Toni Hughes B Rolinda Impson DO [facility] From 7PM - 7AM   06/24/2023, 1:15 AM

## 2023-06-24 NOTE — Consult Note (Signed)
 Urology Consult Note   Requesting Attending Physician:  Etter Hermann., * Service Providing Consult: Urology  Consulting Attending: Homero Luster, MD   Reason for Consult:  left flank pain  HPI: Toni Hughes is seen in consultation for reasons noted above at the request of Etter Hermann., *  for evaluation of left flank pain.  This is a 58 y.o. female with a history of HTN, HLD, asthma, sarcoidosis who presented to [facility] ED with 3 days of left flank pain and dysuria. She had been started on doxycycline on 06/21/23 by an urgent  care for reported positive ucx (unable to view records). In the ED she was noted to be afebrile, HDS, with a leukocytosis to 12.4, no AKI. UA nitrite + with 10-20 WBC, though she is taking azo.  CT scan shows 7mm left UVJ stone. Pain had resolved on morning rounds so KUB was obtained, stone appears to be in the same position and patient denies interval stone passage.   Past Medical History: Past Medical History:  Diagnosis Date   Allergy    Anxiety    Asthma    Heart disease    Hyperlipidemia    Hypertension    Recurrent upper respiratory infection (URI)    Sarcoidosis    Urticaria     Past Surgical History:  Past Surgical History:  Procedure Laterality Date   BREAST LUMPECTOMY Right 10/2015   CARDIAC CATHETERIZATION  04/2015   TOTAL HIP ARTHROPLASTY Left     Medication: Current Facility-Administered Medications  Medication Dose Route Frequency Provider Last Rate Last Admin   0.9 %  sodium chloride infusion   Intravenous Continuous Howerter, Justin B, DO 75 mL/hr at 06/24/23 0601 Infusion Verify at 06/24/23 1610   acetaminophen  (TYLENOL ) tablet 650 mg  650 mg Oral Q6H PRN Howerter, Justin B, DO       Or   acetaminophen  (TYLENOL ) suppository 650 mg  650 mg Rectal Q6H PRN Howerter, Justin B, DO       albuterol  (PROVENTIL ) (2.5 MG/3ML) 0.083% nebulizer solution 2.5 mg  2.5 mg Nebulization Q4H PRN Howerter, Justin B, DO        budeson-glycopyrrolate-formoterol  (BREZTRI ) 160-9-4.8 MCG/ACT inhaler 2 puff  2 puff Inhalation BID Howerter, Justin B, DO   2 puff at 06/24/23 0834   fentaNYL (SUBLIMAZE) injection 25 mcg  25 mcg Intravenous Q2H PRN Howerter, Justin B, DO       ketorolac (TORADOL) 15 MG/ML injection 15 mg  15 mg Intravenous Q6H PRN Howerter, Justin B, DO       levofloxacin (LEVAQUIN) tablet 750 mg  750 mg Oral Daily Howerter, Justin B, DO       naloxone (NARCAN) injection 0.4 mg  0.4 mg Intravenous PRN Howerter, Justin B, DO       ondansetron (ZOFRAN) injection 4 mg  4 mg Intravenous Q6H PRN Howerter, Justin B, DO   4 mg at 06/24/23 0303   pantoprazole (PROTONIX) injection 40 mg  40 mg Intravenous Q24H Howerter, Justin B, DO       sodium chloride flush (NS) 0.9 % injection 10-40 mL  10-40 mL Intracatheter PRN Howerter, Justin B, DO       tamsulosin (FLOMAX) capsule 0.4 mg  0.4 mg Oral Daily Alphonza Ashing, MD   0.4 mg at 06/23/23 2316    Allergies: Allergies  Allergen Reactions   Codeine Hives    Paranoid   Diphenhydramine Hcl Hives   Other Hives and Itching  Peanuts, tree nuts   Terbinafine Hives and Itching    Skin discoloration   Lamisil  [Terbinafine] Hives   Penicillins Hives    Social History: Social History   Tobacco Use   Smoking status: Never   Smokeless tobacco: Never  Vaping Use   Vaping status: Never Used  Substance Use Topics   Alcohol use: No   Drug use: No    Family History Family History  Problem Relation Age of Onset   Asthma Mother    Allergic rhinitis Sister    Asthma Sister    Allergic rhinitis Brother    Migraines Brother    Allergic rhinitis Daughter    Asthma Daughter    Eczema Son    Allergic rhinitis Son    Sinusitis Son    Urticaria Neg Hx     Review of Systems 10 systems were reviewed and are negative except as noted specifically in the HPI.  Objective   Vital signs in last 24 hours: BP (!) 125/90   Pulse 65   Temp 98.4 F (36.9 C) (Oral)    Resp 18   Ht 5' 1.5" (1.562 m)   Wt 66.6 kg   SpO2 99%   BMI 27.29 kg/m   Physical Exam General: NAD, A&O, resting, appropriate HEENT: [place]/AT, EOMI, MMM Pulmonary: Normal work of breathing Cardiovascular: HDS, adequate peripheral perfusion Abdomen: Soft, NTTP, nondistended. GU: voiding spontaneously, no CVA tenderness Extremities: warm and well perfused Neuro: Appropriate, no focal neurological deficits  Most Recent Labs: Lab Results  Component Value Date   WBC 7.8 06/24/2023   HGB 12.7 06/24/2023   HCT 39.5 06/24/2023   PLT 252 06/24/2023    Lab Results  Component Value Date   NA 141 06/24/2023   K 3.1 (L) 06/24/2023   CL 104 06/24/2023   CO2 23 06/24/2023   BUN 16 06/24/2023   CREATININE 0.62 06/24/2023   CALCIUM 9.0 06/24/2023   MG 1.9 06/24/2023    Lab Results  Component Value Date   INR 1.0 06/24/2023     Urine Culture: @[id number] (laburin,org,[id number],[id number])@   IMAGING: DG Abd 1 View Result Date: 06/24/2023 CLINICAL DATA:  Left UVJ stone. EXAM: ABDOMEN - 1 VIEW COMPARISON:  None Available. FINDINGS: The bowel gas pattern is normal. 7 mm calcification overlying the left pelvis consistent with the UVJ stone as depicted on prior CT. IMPRESSION: Left UVJ stone. Electronically Signed   By: Sydell Eva M.D.   On: 06/24/2023 09:38   CT Renal Stone Study Result Date: 06/23/2023 CLINICAL DATA:  Left flank pain, stone suspected EXAM: CT ABDOMEN AND PELVIS WITHOUT CONTRAST TECHNIQUE: Multidetector CT imaging of the abdomen and pelvis was performed following the standard protocol without IV contrast. RADIATION DOSE REDUCTION: This exam was performed according to the departmental dose-optimization program which includes automated exposure control, adjustment of the mA and/or kV according to patient size and/or use of iterative reconstruction technique. COMPARISON:  None Available. FINDINGS: Lower chest: No acute abnormality. Hepatobiliary: Unremarkable liver. Normal  gallbladder. No biliary dilation. Pancreas: Unremarkable. Spleen: Unremarkable. Adrenals/Urinary Tract: Normal adrenal glands. Mild left hydroureteronephrosis. 6-7 mm calcification near the expected location of the left UVJ (series 2/image 73) is suspected to represent an obstructing stone. Evaluation is limited by streak artifact from the left THA. Additional punctate nonobstructing left nephrolithiasis. No urinary calculi or hydronephrosis on the right. Bladder is unremarkable. Stomach/Bowel: Normal caliber large and small bowel. Extensive colonic diverticulosis without diverticulitis no bowel wall thickening. The appendix is normal. Small hiatal  hernia. Vascular/Lymphatic: Aortic atherosclerosis. No enlarged abdominal or pelvic lymph nodes. Reproductive: Unremarkable. Other: No free intraperitoneal fluid or air. Musculoskeletal: No acute fracture.  Left THA. IMPRESSION: 1. Mild left hydroureteronephrosis upstream from a 6-7 mm stone at the left UVJ. 2. Additional punctate nonobstructing left nephrolithiasis. 3. Extensive colonic diverticulosis without diverticulitis. 4. Aortic Atherosclerosis (ICD10-I70.0). Electronically Signed   By: Rozell Cornet M.D.   On: 06/23/2023 21:33    ------  Assessment:  58 y.o. female with a history of  HTN, HLD, asthma, sarcoidosis and 7mm left UVJ stone with superimposed UTI.  Patient is currently asymptomatic and remains in abx. We discussed options including 24 hours of MET in the hospital vs proceeding directly to the OR for cysto, ureteroscopy, stone extraction, possible laser lithotripsy, stent placement. She has been on antibiotics for 3 days at this point and remains afebrile. Discussed that if her urine appears purulent or we are unable to safely get up her ureter with our ureteroscope, we would just plan for stent placement. She elects to proceed with attempted URS/LL.   Risks of procedure discussed including bleeding, infection, damage to nearby structures.    Recommendations: - Continue NPO - OR for cystoscopy, left ureteroscopy, possible ureteroscopic stone extraction with laser lithotripsy, stent placement - Continue levaquin for abx - Flomax, strain all urine    Thank you for this consult. Please contact the urology consult pager with any further questions/concerns.

## 2023-06-24 NOTE — Progress Notes (Addendum)
 PROGRESS NOTE    Toni Hughes  ZOX:[id number] DOB: 1965/07/03 DOA: 06/23/2023 PCP: Domingo Friend, PA-C  Chief Complaint  Patient presents with   Flank Pain    Brief Narrative:    [name] is Toni Hughes 58 y.o. female with medical history significant for essential pretension, hyperlipidemia, moderate persistent asthma, who is admitted to [facility] on 06/23/2023 by way of transfer from [facility] [place] with infected obstructing left ureterolithiasis after presenting from home to the latter facility complaining of left flank pain.   Now s/p stone removal and stent placement by urology.  Assessment & Plan:   Principal Problem:   Left nephrolithiasis Active Problems:   Moderate persistent asthma   GERD (gastroesophageal reflux disease)   Acute cystitis   Obstructive uropathy   Hypokalemia   Dehydration   Acute prerenal azotemia   High anion gap metabolic acidosis   Essential hypertension   HLD (hyperlipidemia)   Acute left flank pain  Left Hydroureteronephrosis  UTI with Obstructing Stone CT with left hydroureteronephrosis UA concerning for UTI with positive nitrite/LE Urine culture pending (has urine culture from [place] from Friday pending as well) Now s/p left ureteroscopy and stone removal, ureteroscopic laser lithotripsy, L ureteral stent placement 5/4 Will transition to bactrim, adjust abx further based on culture results  Hypokalemia Replace and follow   AGMA Resolved  Hypertension BP meds currently on hold, BP ok right now, will hold for now On amlodipine 10 mg, coreg, hydralazine 25 mg BID, imdur, irbesartan (has d/c'd losartan)  Dyslipidemia Statin  CAD  Ischemic Cardiomyopathy Ranexa, statin, aspirin.  Notably, cardiology note from 05/2023 mentions she should be on DAPT with brilinta.  Will clarify.  Urology ok to resume in DAPT tmrw.  Asthma Singulair  Nebs No exacerbation  Hx Sarcoidosis    DVT prophylaxis: SCD Code Status:  full Family Communication: none Disposition:   Status is: Inpatient Remains inpatient appropriate because: need for ongoing inpatient care   Consultants:  urology  Procedures:  Procedure:   Cystoscopy left ureteroscopy and stone removal Ureteroscopic laser lithotripsy left 28F x 22 ureteral stent placement  left retrograde pyelography with interpretation  Antimicrobials:  Anti-infectives (From admission, onward)    Start     Dose/Rate Route Frequency Ordered Stop   06/24/23 1151  ciprofloxacin (CIPRO) 400 MG/200ML IVPB       Note to Pharmacy: Suzette Espy Judd Mccubbin: cabinet override      06/24/23 1151 06/24/23 1240   06/24/23 1000  levofloxacin (LEVAQUIN) tablet 750 mg        750 mg Oral Daily 06/24/23 0155     06/24/23 0000  doxycycline (VIBRA-TABS) 100 MG tablet  Status:  Discontinued        100 mg Oral 2 times daily 06/24/23 1309 06/24/23    06/23/23 2215  levofloxacin (LEVAQUIN) IVPB 500 mg        500 mg 100 mL/hr over 60 Minutes Intravenous  Once 06/23/23 2212 06/24/23 0001   06/23/23 2200  cefTRIAXone (ROCEPHIN) 1 g in sodium chloride 0.9 % 100 mL IVPB  Status:  Discontinued        1 g 200 mL/hr over 30 Minutes Intravenous  Once 06/23/23 2145 06/23/23 2152       Subjective: No complaints  Objective: Vitals:   06/24/23 1329 06/24/23 1330 06/24/23 1343 06/24/23 1410  BP: 99/66 98/67 101/68 125/80  Pulse: 78 73 76 (!) 56  Resp: 14 13 14 14   Temp:    (!) 97.4 F (36.3  C)  TempSrc:    Oral  SpO2: 95% 95% 96% 96%  Weight:      Height:        Intake/Output Summary (Last 24 hours) at 06/24/2023 1517 Last data filed at 06/24/2023 1313 Gross per 24 hour  Intake 1146.78 ml  Output --  Net 1146.78 ml   Filed Weights   06/23/23 1917 06/24/23 0057 06/24/23 0452  Weight: 66.7 kg 66.7 kg 66.6 kg    Examination:  General exam: Appears calm and comfortable  Respiratory system: unlabored Cardiovascular system: RRR Gastrointestinal system: Abdomen is nondistended,  soft and nontender. No CVA tenderness Central nervous system: Alert and oriented. No focal neurological deficits. Extremities: no LEE    Data Reviewed: I have personally reviewed following labs and imaging studies  CBC: Recent Labs  Lab 06/23/23 2040 06/24/23 0236  WBC 12.4* 7.8  NEUTROABS 10.1* 4.9  HGB 13.5 12.7  HCT 40.6 39.5  MCV 92.1 95.6  PLT 304 252    Basic Metabolic Panel: Recent Labs  Lab 06/23/23 2040 06/24/23 0236  NA 142 141  K 3.2* 3.1*  CL 103 104  CO2 21* 23  GLUCOSE 115* 114*  BUN 19 16  CREATININE 0.89 0.62  CALCIUM 10.3 9.0  MG  --  1.9    GFR: Estimated Creatinine Clearance: 66.9 mL/min (by C-G formula based on SCr of 0.62 mg/dL).  Liver Function Tests: Recent Labs  Lab 06/23/23 2040 06/24/23 0236  AST 15 13*  ALT 7 11  ALKPHOS 96 68  BILITOT 0.5 1.1  PROT 7.6 6.8  ALBUMIN 4.3 3.7    CBG: No results for input(s): "GLUCAP" in the last 168 hours.   No results found for this or any previous visit (from the past 240 hours).       Radiology Studies: DG C-Arm 1-60 Min-No Report Result Date: 06/24/2023 Fluoroscopy was utilized by the requesting physician.  No radiographic interpretation.   DG Abd 1 View Result Date: 06/24/2023 CLINICAL DATA:  Left UVJ stone. EXAM: ABDOMEN - 1 VIEW COMPARISON:  None Available. FINDINGS: The bowel gas pattern is normal. 7 mm calcification overlying the left pelvis consistent with the UVJ stone as depicted on prior CT. IMPRESSION: Left UVJ stone. Electronically Signed   By: Sydell Eva M.D.   On: 06/24/2023 09:38   CT Renal Stone Study Result Date: 06/23/2023 CLINICAL DATA:  Left flank pain, stone suspected EXAM: CT ABDOMEN AND PELVIS WITHOUT CONTRAST TECHNIQUE: Multidetector CT imaging of the abdomen and pelvis was performed following the standard protocol without IV contrast. RADIATION DOSE REDUCTION: This exam was performed according to the departmental dose-optimization program which includes  automated exposure control, adjustment of the mA and/or kV according to patient size and/or use of iterative reconstruction technique. COMPARISON:  None Available. FINDINGS: Lower chest: No acute abnormality. Hepatobiliary: Unremarkable liver. Normal gallbladder. No biliary dilation. Pancreas: Unremarkable. Spleen: Unremarkable. Adrenals/Urinary Tract: Normal adrenal glands. Mild left hydroureteronephrosis. 6-7 mm calcification near the expected location of the left UVJ (series 2/image 73) is suspected to represent an obstructing stone. Evaluation is limited by streak artifact from the left THA. Additional punctate nonobstructing left nephrolithiasis. No urinary calculi or hydronephrosis on the right. Bladder is unremarkable. Stomach/Bowel: Normal caliber large and small bowel. Extensive colonic diverticulosis without diverticulitis no bowel wall thickening. The appendix is normal. Small hiatal hernia. Vascular/Lymphatic: Aortic atherosclerosis. No enlarged abdominal or pelvic lymph nodes. Reproductive: Unremarkable. Other: No free intraperitoneal fluid or air. Musculoskeletal: No acute fracture.  Left THA.  IMPRESSION: 1. Mild left hydroureteronephrosis upstream from Mahreen Schewe 6-7 mm stone at the left UVJ. 2. Additional punctate nonobstructing left nephrolithiasis. 3. Extensive colonic diverticulosis without diverticulitis. 4. Aortic Atherosclerosis (ICD10-I70.0). Electronically Signed   By: Rozell Cornet M.D.   On: 06/23/2023 21:33        Scheduled Meds:  budeson-glycopyrrolate-formoterol   2 puff Inhalation BID   levofloxacin  750 mg Oral Daily   pantoprazole (PROTONIX) IV  40 mg Intravenous Q24H   tamsulosin  0.4 mg Oral Daily   Continuous Infusions:  sodium chloride 75 mL/hr at 06/24/23 0601     LOS: 0 days    Time spent: over 30 min    Donnetta Gains, MD [facility]   To contact the attending provider between 7A-7P or the covering provider during after hours 7P-7A, please log into  the web site [email or web address] and access using universal [place] password for that web site. If you do not have the password, please call the hospital operator.  06/24/2023, 3:17 PM

## 2023-06-24 NOTE — TOC Initial Note (Signed)
 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  Expected Discharge Plan: Home/Self Care Barriers to Discharge: No Barriers Identified   Patient Goals and CMS Choice Patient states their goals for this hospitalization and ongoing recovery are:: home CMS Medicare.gov Compare Post Acute Care list provided to:: Patient        Expected Discharge Plan and Services   Discharge Planning Services: CM Consult   Living arrangements for the past 2 months: Single Family Home Expected Discharge Date: 06/24/23               DME Arranged: N/A DME Agency: NA       HH Arranged: NA HH Agency: NA        Prior Living Arrangements/Services Living arrangements for the past 2 months: Single Family Home Lives with:: Self Patient language and need for interpreter reviewed:: Yes Do you feel safe going back to the place where you live?: Yes      Need for Family Participation in Patient Care: Yes (Comment) Care giver support system in place?: Yes (comment) Current home services: DME (BSC) Criminal Activity/Legal Involvement Pertinent to Current Situation/Hospitalization: No - Comment as needed  Activities of Daily Living   ADL Screening (condition at time of admission) Independently performs ADLs?: Yes (appropriate for developmental age) Is the patient deaf or have difficulty hearing?: No Does the patient have difficulty seeing, even when wearing glasses/contacts?:  No Does the patient have difficulty concentrating, remembering, or making decisions?: No  Permission Sought/Granted Permission sought to share information with : Case Manager Permission granted to share information with : Yes, Verbal Permission Granted  Share Information with NAME: Case Manager     Permission granted to share info w Relationship: Johnnette Nakayama (son) [phone]     Emotional Assessment Appearance:: Appears stated age Attitude/Demeanor/Rapport: Gracious Affect (typically observed): Accepting Orientation: : Oriented to Self, Oriented to Place, Oriented to  Time, Oriented to Situation Alcohol / Substance Use: Not Applicable Psych Involvement: No (comment)  Admission diagnosis:  Kidney stone [N20.0] Nephrolithiasis [N20.0] Urinary tract infection with hematuria, site unspecified [N39.0, R31.9] Patient Active Problem List   Diagnosis Date Noted   Acute cystitis 06/24/2023   Obstructive uropathy 06/24/2023   Hypokalemia 06/24/2023   Dehydration 06/24/2023   Acute prerenal azotemia 06/24/2023   High anion gap metabolic acidosis 06/24/2023   Essential hypertension 06/24/2023   HLD (hyperlipidemia) 06/24/2023   Acute left flank pain 06/24/2023   Nephrolithiasis 06/24/2023   Left nephrolithiasis 06/23/2023   Chronic rhinitis 03/20/2022   Sprain of ankle 11/13/2018   Acute sinusitis 09/27/2016   Perennial and seasonal allergic rhinitis 07/06/2016   Allergic conjunctivitis 07/06/2016   Moderate persistent asthma 07/06/2016   Cough, persistent 07/06/2016   GERD (gastroesophageal reflux disease) 07/06/2016   Sarcoidosis 07/06/2016   Food allergy 07/06/2016   PCP:  Domingo Friend, PA-C Pharmacy:   [id number] [place], [place] - [address] AT [facility] [address] [place] [address] Phone: [phone] Fax: [phone]     Social Drivers of Health (SDOH) Social History: SDOH Screenings   Food Insecurity: No Food Insecurity  (06/24/2023)  Housing: Low Risk  (06/24/2023)  Transportation Needs: No Transportation Needs (06/24/2023)  Utilities: Not At Risk (06/24/2023)  Financial Resource Strain: Medium Risk (09/08/2022)   Received from [facility]  Physical Activity: Insufficiently Active (09/08/2022)   Received from [facility]  Social Connections: Unknown (06/24/2023)  Stress: No Stress Concern Present (01/02/2023)   Received from [facility]  Tobacco Use: Low Risk  (06/24/2023)  Recent Concern: Tobacco Use - Medium Risk (05/23/2023)   Received from [facility]   SDOH Interventions: Food Insecurity Interventions: Intervention Not Indicated, Inpatient TOC Housing Interventions: Intervention Not Indicated, Inpatient TOC Transportation Interventions: Intervention Not Indicated, Inpatient TOC Utilities Interventions: Intervention Not Indicated, Inpatient TOC   Readmission Risk Interventions     No data to display

## 2023-06-24 NOTE — Transfer of Care (Signed)
 Immediate Anesthesia Transfer of Care Note  Patient: Toni Hughes  Procedure(s) Performed: CYSTOSCOPY/URETEROSCOPY/BASKET STONE EXTRACTION/STENT PLACEMENT (Left: Ureter)  Patient Location: PACU  Anesthesia Type:General  Level of Consciousness: sedated  Airway & Oxygen Therapy: Patient Spontanous Breathing  Post-op Assessment: Report given to RN  Post vital signs: Reviewed and stable  Last Vitals:  Vitals Value Taken Time  BP 98/61 06/24/23 1311  Temp    Pulse 80 06/24/23 1312  Resp 13 06/24/23 1312  SpO2 92 % 06/24/23 1312  Vitals shown include unfiled device data.  Last Pain:  Vitals:   06/24/23 1144  TempSrc:   PainSc: 0-No pain         Complications: No notable events documented.

## 2023-06-24 NOTE — Discharge Instructions (Addendum)
 DISCHARGE INSTRUCTIONS FOR KIDNEY STONE/URETERAL STENT   MEDICATIONS:  1.  Resume all your other meds from home - except do not take any extra narcotic pain meds that you may have at home.  2. Pyridium is to help with the burning/stinging when you urinate. 3. Take the antibiotic as prescribed 4. Take the toradol as prescribed for pain control 5. Recommend taking 1000mg  of tylenol  every 6 hours for 1 week  ACTIVITY:  1. No strenuous activity x 1week  2. No driving while on narcotic pain medications  3. Drink plenty of water  4. Continue to walk at home - you can still get blood clots when you are at home, so keep active, but don't over do it.  5. May return to work/school tomorrow or when you feel ready  6. Avoid lifting over 10lb for the 3-4 days   BATHING:  1. You can shower and we recommend daily showers  2. You have a string coming from your urethra: The stent string is attached to your ureteral stent. Do not pull on this.   SIGNS/SYMPTOMS TO CALL:  Please call us  if you have a fever greater than 101.5, uncontrolled nausea/vomiting, uncontrolled pain, dizziness, unable to urinate, bloody urine, chest pain, shortness of breath, leg swelling, leg pain, redness around wound, drainage from wound, or any other concerns or questions.   You can reach us  at [phone].   FOLLOW-UP:  1. We will call you to schedule a follow up visit in 1-2 weeks 2. You have a string attached to your stent, you may remove it on Thursday, 5/8. To do this, pull the strings until the stents are completely removed. You may feel an odd sensation in your back.

## 2023-06-25 ENCOUNTER — Encounter (HOSPITAL_COMMUNITY): Payer: Self-pay | Admitting: Urology

## 2023-06-28 ENCOUNTER — Emergency Department (HOSPITAL_COMMUNITY)
Admission: EM | Admit: 2023-06-28 | Discharge: 2023-06-28 | Disposition: A | Discharge status: Home / Self Care | Attending: Emergency Medicine | Admitting: Emergency Medicine

## 2023-06-28 ENCOUNTER — Emergency Department (HOSPITAL_COMMUNITY)

## 2023-06-28 ENCOUNTER — Other Ambulatory Visit: Payer: Self-pay

## 2023-06-28 DIAGNOSIS — N132 Hydronephrosis with renal and ureteral calculous obstruction: Secondary | ICD-10-CM | POA: Diagnosis not present

## 2023-06-28 DIAGNOSIS — R339 Retention of urine, unspecified: Secondary | ICD-10-CM | POA: Diagnosis not present

## 2023-06-28 DIAGNOSIS — R109 Unspecified abdominal pain: Secondary | ICD-10-CM | POA: Diagnosis present

## 2023-06-28 DIAGNOSIS — N1330 Unspecified hydronephrosis: Secondary | ICD-10-CM

## 2023-06-28 DIAGNOSIS — R10A0 Flank pain, unspecified side: Secondary | ICD-10-CM

## 2023-06-28 LAB — CBC WITH DIFFERENTIAL/PLATELET
Abs Immature Granulocytes: 0.03 10*3/uL (ref 0.00–0.07)
Basophils Absolute: 0 10*3/uL (ref 0.0–0.1)
Basophils Relative: 0 %
Eosinophils Absolute: 0.2 10*3/uL (ref 0.0–0.5)
Eosinophils Relative: 1 %
HCT: 40.2 % (ref 36.0–46.0)
Hemoglobin: 12.8 g/dL (ref 12.0–15.0)
Immature Granulocytes: 0 %
Lymphocytes Relative: 9 %
Lymphs Abs: 1.1 10*3/uL (ref 0.7–4.0)
MCH: 30.4 pg (ref 26.0–34.0)
MCHC: 31.8 g/dL (ref 30.0–36.0)
MCV: 95.5 fL (ref 80.0–100.0)
Monocytes Absolute: 0.9 10*3/uL (ref 0.1–1.0)
Monocytes Relative: 7 %
Neutro Abs: 10.1 10*3/uL — ABNORMAL HIGH (ref 1.7–7.7)
Neutrophils Relative %: 83 %
Platelets: 268 10*3/uL (ref 150–400)
RBC: 4.21 MIL/uL (ref 3.87–5.11)
RDW: 14.3 % (ref 11.5–15.5)
WBC: 12.3 10*3/uL — ABNORMAL HIGH (ref 4.0–10.5)
nRBC: 0 % (ref 0.0–0.2)

## 2023-06-28 LAB — COMPREHENSIVE METABOLIC PANEL WITH GFR
ALT: 10 U/L (ref 0–44)
AST: 16 U/L (ref 15–41)
Albumin: 3.8 g/dL (ref 3.5–5.0)
Alkaline Phosphatase: 67 U/L (ref 38–126)
Anion gap: 9 (ref 5–15)
BUN: 19 mg/dL (ref 6–20)
CO2: 22 mmol/L (ref 22–32)
Calcium: 8.9 mg/dL (ref 8.9–10.3)
Chloride: 106 mmol/L (ref 98–111)
Creatinine, Ser: 0.92 mg/dL (ref 0.44–1.00)
GFR, Estimated: >60 mL/min (ref >60)
Glucose, Bld: 123 mg/dL — ABNORMAL HIGH (ref 70–99)
Potassium: 3.6 mmol/L (ref 3.5–5.1)
Sodium: 137 mmol/L (ref 135–145)
Total Bilirubin: 0.7 mg/dL (ref 0.0–1.2)
Total Protein: 7.1 g/dL (ref 6.5–8.1)

## 2023-06-28 LAB — URINALYSIS, ROUTINE W REFLEX MICROSCOPIC

## 2023-06-28 LAB — URINALYSIS, MICROSCOPIC (REFLEX): RBC / HPF: >50 RBC/hpf (ref 0–5)

## 2023-06-28 MED ORDER — KETOROLAC TROMETHAMINE 30 MG/ML IJ SOLN
30.0000 mg | Freq: Once | INTRAMUSCULAR | Status: AC
  Administered 2023-06-28: 30 mg via INTRAVENOUS
  Filled 2023-06-28: qty 1

## 2023-06-28 MED ORDER — HYDROMORPHONE HCL 1 MG/ML IJ SOLN
0.5000 mg | Freq: Once | INTRAMUSCULAR | Status: AC
  Administered 2023-06-28: 0.5 mg via INTRAVENOUS
  Filled 2023-06-28: qty 1

## 2023-06-28 MED ORDER — ONDANSETRON HCL 4 MG/2ML IJ SOLN
4.0000 mg | Freq: Once | INTRAMUSCULAR | Status: AC
  Administered 2023-06-28: 4 mg via INTRAVENOUS
  Filled 2023-06-28: qty 2

## 2023-06-28 MED ORDER — OXYCODONE-ACETAMINOPHEN 5-325 MG PO TABS
2.0000 | ORAL_TABLET | Freq: Once | ORAL | Status: AC
  Administered 2023-06-28: 2 via ORAL
  Filled 2023-06-28: qty 2

## 2023-06-28 NOTE — ED Notes (Signed)
 Pt stated after using the restroom she saw a lot of clots, RN made aware

## 2023-06-28 NOTE — ED Provider Notes (Signed)
 [facility] Emergency Department Provider Note MRN:  [id number]  Arrival date & time: 06/28/23     Chief Complaint   Flank Pain (L)   History of Present Illness   Toni Hughes is a 58 y.o. year-old female presents to the ED with chief complaint of left side flank pain.  She had recent cystoscopy and ureteral stent placement for a 7 mm UVJ stone.  She states that she pulled the stent this morning.  She has had hematuria.  She complains of persistent pain.  Reports persistent hematuria.  She reports associated nausea.  She denies vomiting.  He denies fevers or chills.  Denies any other associated symptoms..  History provided by patient.   Review of Systems  Pertinent positive and negative review of systems noted in HPI.    Physical Exam   Vitals:   06/28/23 0358  BP: (!) 147/99  Pulse: 73  Resp: 16  Temp: 97.7 F (36.5 C)  SpO2: 98%    CONSTITUTIONAL:  non toxic-appearing, NAD NEURO:  Alert and oriented x 3, CN 3-12 grossly intact EYES:  eyes equal and reactive ENT/NECK:  Supple, no stridor  CARDIO:  normal rate, regular rhythm, appears well-perfused  PULM:  No respiratory distress, CTAB GI/GU:  non-distended,  MSK/SPINE:  No gross deformities, no edema, moves all extremities  SKIN:  no rash, atraumatic   *Additional and/or pertinent findings included in MDM below  Diagnostic and Interventional Summary    EKG Interpretation Date/Time:    Ventricular Rate:    PR Interval:    QRS Duration:    QT Interval:    QTC Calculation:   R Axis:      Text Interpretation:         Labs Reviewed  URINALYSIS, ROUTINE W REFLEX MICROSCOPIC - Abnormal; Notable for the following components:      Result Value   Color, Urine RED (*)    APPearance CLOUDY (*)    Glucose, UA   (*)    Value: TEST NOT REPORTED DUE TO COLOR INTERFERENCE OF URINE PIGMENT   Hgb urine dipstick   (*)    Value: TEST NOT REPORTED DUE TO COLOR INTERFERENCE OF URINE  PIGMENT   Bilirubin Urine   (*)    Value: TEST NOT REPORTED DUE TO COLOR INTERFERENCE OF URINE PIGMENT   Ketones, ur   (*)    Value: TEST NOT REPORTED DUE TO COLOR INTERFERENCE OF URINE PIGMENT   Protein, ur   (*)    Value: TEST NOT REPORTED DUE TO COLOR INTERFERENCE OF URINE PIGMENT   Nitrite   (*)    Value: TEST NOT REPORTED DUE TO COLOR INTERFERENCE OF URINE PIGMENT   Leukocytes,Ua   (*)    Value: TEST NOT REPORTED DUE TO COLOR INTERFERENCE OF URINE PIGMENT   All other components within normal limits  CBC WITH DIFFERENTIAL/PLATELET - Abnormal; Notable for the following components:   WBC 12.3 (*)    Neutro Abs 10.1 (*)    All other components within normal limits  COMPREHENSIVE METABOLIC PANEL WITH GFR - Abnormal; Notable for the following components:   Glucose, Bld 123 (*)    All other components within normal limits  URINALYSIS, MICROSCOPIC (REFLEX) - Abnormal; Notable for the following components:   Bacteria, UA RARE (*)    All other components within normal limits    CT Renal Stone Study  Final Result      Medications  HYDROmorphone (DILAUDID) injection 0.5 mg (0.5  mg Intravenous Given 06/28/23 0429)  ondansetron (ZOFRAN) injection 4 mg (4 mg Intravenous Given 06/28/23 0430)  ketorolac (TORADOL) 30 MG/ML injection 30 mg (30 mg Intravenous Given 06/28/23 0605)  oxyCODONE-acetaminophen  (PERCOCET/ROXICET) 5-325 MG per tablet 2 tablet (2 tablets Oral Given 06/28/23 0606)     Procedures  /  Critical Care Procedures  ED Course and Medical Decision Making  I have reviewed the triage vital signs, the nursing notes, and pertinent available records from the EMR.  Social Determinants Affecting Complexity of Care: Patient has no clinically significant social determinants affecting this chief complaint..   ED Course: Clinical Course as of 06/28/23 0631  Thu Jun 28, 2023  0622 Post void residual 457 mL.  Will insert foley catheter. [RB]    Clinical Course User Index [RB] Sherel Dikes, PA-C    Medical Decision Making Patient here with left flank pain.  Had ureteroscopy and stent placement on 5/4 for 7 mm UVJ stone.  She reports that she pulled the stent yesterday.  She has had increased left flank pain as well as nausea.  Denies fever or vomiting.  She has been taking Bactrim and Flomax.  Will check labs, UA, and CT for further evaluation.  CT discussed below.  Will insert Foley catheter for urinary retention.  Patient's pain has improved.  Consult with urology as discussed below.    Amount and/or Complexity of Data Reviewed Labs: ordered. Radiology: ordered.    Details: CT shows increased hydroureteronephrosis, stone is no longer present.  No fever or vomiting.  Pyelonephritis thought less likely.  Symptoms thought secondary to ureteral spasm from recent stent removal.  Risk Prescription drug management.         Consultants: I consulted with Dr. Aden Agreste, who recommends continuing Flomax and Bactrim, recommends checking postvoid residual and inserting Foley catheter if greater than 350 mL.  States that patient can follow-up in the office.  Patient has an appointment for later today.   Treatment and Plan: I considered admission due to patient's initial presentation, but after considering the examination and diagnostic results, patient will not require admission and can be discharged with outpatient follow-up.    Final Clinical Impressions(s) / ED Diagnoses     ICD-10-CM   1. Flank pain  R10.9     2. Hydronephrosis, unspecified hydronephrosis type  N13.30     3. Urinary retention  R33.9       ED Discharge Orders     None         Discharge Instructions Discussed with and Provided to Patient:     Discharge Instructions      Continue taking your medications as prescribed.  Please follow-up with the urologist today, keep your appointment for 9 AM.  Return for new or worsening symptoms.       Sherel Dikes, PA-C 06/28/23 0631     Palumbo, April, MD 06/28/23 [phone]

## 2023-06-28 NOTE — ED Notes (Addendum)
 Pt ambulatory to the restroom.

## 2023-06-28 NOTE — ED Notes (Signed)
 Pt ambulatory to the restroom.

## 2023-06-28 NOTE — Discharge Instructions (Addendum)
 Continue taking your medications as prescribed.  Please follow-up with the urologist today, keep your appointment for 9 AM.  Return for new or worsening symptoms.

## 2023-06-28 NOTE — ED Triage Notes (Signed)
 Pt reports left sided flank pain with pressure when she goes urinate. Did have a stent on 5/4.
